# Patient Record
Sex: Male | Born: 1995 | Race: Black or African American | Hispanic: No | Marital: Single | Smoking: Never smoker
Health system: Southern US, Community
[De-identification: ages and names within clinical notes are randomized; demographics above are authoritative.]

## PROBLEM LIST (undated history)

## (undated) ENCOUNTER — Emergency Department (HOSPITAL_BASED_OUTPATIENT_CLINIC_OR_DEPARTMENT_OTHER): Discharge status: Against Medical Advice | Payer: No Typology Code available for payment source

## (undated) DIAGNOSIS — M419 Scoliosis, unspecified: Secondary | ICD-10-CM

---
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
## 2013-03-23 ENCOUNTER — Encounter (HOSPITAL_BASED_OUTPATIENT_CLINIC_OR_DEPARTMENT_OTHER): Payer: Self-pay | Admitting: Emergency Medicine

## 2013-03-23 ENCOUNTER — Emergency Department (HOSPITAL_BASED_OUTPATIENT_CLINIC_OR_DEPARTMENT_OTHER)
Admission: EM | Admit: 2013-03-23 | Discharge: 2013-03-23 | Disposition: A | Discharge status: Home / Self Care | Payer: No Typology Code available for payment source | Attending: Emergency Medicine | Admitting: Emergency Medicine

## 2013-03-23 DIAGNOSIS — IMO0002 Reserved for concepts with insufficient information to code with codable children: Secondary | ICD-10-CM | POA: Insufficient documentation

## 2013-03-23 DIAGNOSIS — Z8739 Personal history of other diseases of the musculoskeletal system and connective tissue: Secondary | ICD-10-CM | POA: Insufficient documentation

## 2013-03-23 DIAGNOSIS — Z79899 Other long term (current) drug therapy: Secondary | ICD-10-CM | POA: Insufficient documentation

## 2013-03-23 DIAGNOSIS — R369 Urethral discharge, unspecified: Secondary | ICD-10-CM | POA: Insufficient documentation

## 2013-03-23 DIAGNOSIS — Z792 Long term (current) use of antibiotics: Secondary | ICD-10-CM | POA: Insufficient documentation

## 2013-03-23 DIAGNOSIS — Z87448 Personal history of other diseases of urinary system: Secondary | ICD-10-CM | POA: Insufficient documentation

## 2013-03-23 LAB — URINALYSIS, ROUTINE W REFLEX MICROSCOPIC
Bilirubin Urine: NEGATIVE
Glucose, UA: NEGATIVE mg/dL
Nitrite: NEGATIVE
Specific Gravity, Urine: 1.024 (ref 1.005–1.030)
pH: 6 (ref 5.0–8.0)

## 2013-03-23 LAB — URINE MICROSCOPIC-ADD ON

## 2013-03-23 MED ORDER — LEVOFLOXACIN 750 MG PO TABS: 750.0000 mg | ORAL_TABLET | Freq: Once | ORAL | Status: DC

## 2013-03-23 MED ORDER — AZITHROMYCIN 250 MG PO TABS
1000.0000 mg | ORAL_TABLET | Freq: Once | ORAL | Status: AC
  Administered 2013-03-23: 1000 mg via ORAL
  Filled 2013-03-23: qty 4

## 2013-03-23 MED ORDER — CEFTRIAXONE SODIUM 250 MG IJ SOLR
250.0000 mg | Freq: Once | INTRAMUSCULAR | Status: AC
  Administered 2013-03-23: 250 mg via INTRAMUSCULAR
  Filled 2013-03-23: qty 250

## 2013-03-23 NOTE — ED Provider Notes (Signed)
CSN: [id number]     Arrival date & time 03/23/13  1315 History   First MD Initiated Contact with Patient 03/23/13 1336     Chief Complaint  Patient presents with  . Dysuria   (Consider location/radiation/quality/duration/timing/severity/associated sxs/prior Treatment) Patient is a 17 y.o. male presenting with dysuria.  Dysuria Pertinent negatives include no chills, fever or sore throat.    17 y/o male here with dysuria and penile discharge for 5 days. He states that the discharge was creamy and white and has now resolved. His girlfriend has been seen by another physician and has been treated for gonorrhea and chlamydia.  He has had an episode of gonorrhea previously and this is very similar to that episode. He denies fevers, chills, sweats, joint pains, and and sore throat.   Past Medical History  Diagnosis Date  . Scoliosis    History reviewed. No pertinent past surgical history. No family history on file. History  Substance Use Topics  . Smoking status: Never Smoker   . Smokeless tobacco: Never Used  . Alcohol Use: No    Review of Systems  Constitutional: Negative for fever and chills.  HENT: Negative for sore throat.   Genitourinary: Positive for dysuria and discharge. Negative for urgency, penile swelling, scrotal swelling, genital sores, penile pain and testicular pain.  All other systems reviewed and are negative.    Allergies  Review of patient's allergies indicates no known allergies.  Home Medications   Current Outpatient Rx  Name  Route  Sig  Dispense  Refill  . cephALEXin (KEFLEX) 500 MG capsule   Oral   Take 1 capsule (500 mg total) by mouth 4 (four) times daily.   40 capsule   0   . docusate sodium (COLACE) 100 MG capsule   Oral   Take 1 capsule (100 mg total) by mouth every 12 (twelve) hours.   60 capsule   0   . HYDROcodone-acetaminophen (NORCO/VICODIN) 5-325 MG per tablet   Oral   Take 1 tablet by mouth every 6 (six) hours as needed for pain.   9 tablet   0   . HYDROcodone-acetaminophen (VICODIN) 5-500 MG per tablet   Oral   Take 1-2 tablets by mouth every 6 (six) hours as needed for pain.   10 tablet   0   . hydrocortisone (ANUSOL-HC) 2.5 % rectal cream      Apply rectally 2 times daily   30 g   0   . ibuprofen (ADVIL,MOTRIN) 600 MG tablet   Oral   Take 1 tablet (600 mg total) by mouth every 8 (eight) hours as needed for pain. Take with food.   20 tablet   0   . polyethylene glycol (MIRALAX) packet   Oral   Take 17 g by mouth daily.   14 each   0   . traMADol (ULTRAM) 50 MG tablet   Oral   Take 1 tablet (50 mg total) by mouth every 6 (six) hours as needed for pain.   6 tablet   0    BP 131/69  Pulse 70  Temp(Src) 98.3 F (36.8 C) (Oral)  Resp 20  SpO2 100% Physical Exam  Constitutional: He appears well-developed and well-nourished. No distress.  HENT:  Head: Normocephalic and atraumatic.  Right Ear: External ear normal.  Left Ear: External ear normal.  Eyes: Conjunctivae are normal. Pupils are equal, round, and reactive to light.  Neck: Neck supple.  Cardiovascular: Normal rate, regular rhythm and normal heart sounds.  No murmur heard. Pulmonary/Chest: Effort normal and breath sounds normal. No respiratory distress. He has no wheezes.  Abdominal: Soft. Bowel sounds are normal.  Genitourinary: Testes normal and penis normal. Right testis shows no mass and no tenderness. Left testis shows no mass and no tenderness. Circumcised. No penile tenderness. No discharge found.  Musculoskeletal: He exhibits no edema and no tenderness.  Neurological: He is alert.  Skin: Skin is warm and dry.  Psychiatric: He has a normal mood and affect.    ED Course  Procedures (including critical care time) Labs Review Labs Reviewed  GC/CHLAMYDIA PROBE AMP  URINALYSIS, ROUTINE W REFLEX MICROSCOPIC   Imaging Review No results found.  EKG Interpretation   None       MDM   1. Penile discharge    Penile  discharge, consistent with Gonorrhea.  Given his PMH and partner with GC will treat with Rocephin and azithro empirically UA, Urine GC probe.    Murtis Sink, MD [facility] Resident, PGY-2 03/23/2013, 2:01 PM       Elenora Gamma, MD 03/23/13 [phone]

## 2013-03-23 NOTE — ED Notes (Signed)
Dysuria and penile d/c x 3 days

## 2013-03-24 LAB — URINE CULTURE: Colony Count: NO GROWTH

## 2013-03-24 NOTE — ED Provider Notes (Signed)
I saw and evaluated the patient, reviewed the resident's note and I agree with the findings and plan.  EKG Interpretation   None       Pt with dysuria and penile discharge, h/o STD refuses urethral swab, UA sent, GC/C sent on urine specimen. Treated empirically. Health Dept follow up.   Charles B. Bernette Mayers, MD 03/24/13 1049

## 2013-03-25 LAB — GC/CHLAMYDIA PROBE AMP: GC Probe RNA: POSITIVE — AB

## 2013-03-27 NOTE — ED Notes (Signed)
+  Gonorrhea and chlamydia Patient treated with Rocephin and zithromax-[facility]

## 2013-03-28 ENCOUNTER — Telehealth (HOSPITAL_COMMUNITY): Payer: Self-pay | Admitting: *Deleted

## 2013-04-01 NOTE — ED Notes (Signed)
Letter sent to [facility] address 

## 2014-05-21 ENCOUNTER — Emergency Department (HOSPITAL_BASED_OUTPATIENT_CLINIC_OR_DEPARTMENT_OTHER): Payer: Medicaid Other

## 2014-05-21 ENCOUNTER — Emergency Department (HOSPITAL_BASED_OUTPATIENT_CLINIC_OR_DEPARTMENT_OTHER)
Admission: EM | Admit: 2014-05-21 | Discharge: 2014-05-21 | Disposition: A | Discharge status: Home / Self Care | Payer: Medicaid Other | Attending: Emergency Medicine | Admitting: Emergency Medicine

## 2014-05-21 ENCOUNTER — Encounter (HOSPITAL_BASED_OUTPATIENT_CLINIC_OR_DEPARTMENT_OTHER): Payer: Self-pay | Admitting: Emergency Medicine

## 2014-05-21 DIAGNOSIS — Z7952 Long term (current) use of systemic steroids: Secondary | ICD-10-CM | POA: Insufficient documentation

## 2014-05-21 DIAGNOSIS — Z79899 Other long term (current) drug therapy: Secondary | ICD-10-CM | POA: Insufficient documentation

## 2014-05-21 DIAGNOSIS — R001 Bradycardia, unspecified: Secondary | ICD-10-CM | POA: Insufficient documentation

## 2014-05-21 DIAGNOSIS — R1110 Vomiting, unspecified: Secondary | ICD-10-CM | POA: Insufficient documentation

## 2014-05-21 DIAGNOSIS — M419 Scoliosis, unspecified: Secondary | ICD-10-CM | POA: Insufficient documentation

## 2014-05-21 DIAGNOSIS — Z792 Long term (current) use of antibiotics: Secondary | ICD-10-CM | POA: Insufficient documentation

## 2014-05-21 DIAGNOSIS — J069 Acute upper respiratory infection, unspecified: Secondary | ICD-10-CM | POA: Insufficient documentation

## 2014-05-21 LAB — RAPID STREP SCREEN (MED CTR MEBANE ONLY): STREPTOCOCCUS, GROUP A SCREEN (DIRECT): NEGATIVE

## 2014-05-21 MED ORDER — PROMETHAZINE HCL 25 MG PO TABS: 25.0000 mg | ORAL_TABLET | Freq: Four times a day (QID) | ORAL | Status: DC | PRN

## 2014-05-21 NOTE — ED Notes (Signed)
PT presents to ED with complaints of fever vomiting dizzy for a week.

## 2014-05-21 NOTE — ED Provider Notes (Signed)
CSN: [id number]     Arrival date & time 05/21/14  1418 History  This chart was scribed for Tilden Fossa, MD by Modena Jansky, ED Scribe. This patient was seen in room MH01/MH01 and the patient's care was started at 3:41 PM.   Chief Complaint  Patient presents with  . Emesis  . Fever  . Dizziness   The history is provided by the patient. No language interpreter was used.   HPI Comments: Edward Bray is a 19 y.o. male who presents to the Emergency Department complaining of cough, chest pain, fever, sore throat.  He reports that he could not get out bed this morning due to his worsening subjective fever and associated symptoms. He states that his associated symptoms include chills, chest pain, headache that started this morning, cough productive of mucous, and a sore throat. He denies any dysuria, ear pain, or rash.   Past Medical History  Diagnosis Date  . Scoliosis    History reviewed. No pertinent past surgical history. No family history on file. History  Substance Use Topics  . Smoking status: Never Smoker   . Smokeless tobacco: Never Used  . Alcohol Use: No    Review of Systems  Constitutional: Positive for fever and chills.  HENT: Positive for sore throat. Negative for ear pain.   Respiratory: Positive for cough.   Cardiovascular: Positive for chest pain.  Genitourinary: Negative for dysuria.  Skin: Negative for rash.  Neurological: Positive for headaches.  All other systems reviewed and are negative.   Allergies  Review of patient's allergies indicates no known allergies.  Home Medications   Prior to Admission medications   Medication Sig Start Date End Date Taking? Authorizing Provider  cephALEXin (KEFLEX) 500 MG capsule Take 1 capsule (500 mg total) by mouth 4 (four) times daily. 10/17/12   Robyn M Hess, PA-C  docusate sodium (COLACE) 100 MG capsule Take 1 capsule (100 mg total) by mouth every 12 (twelve) hours. 04/14/12   Glynn Octave, MD   HYDROcodone-acetaminophen (NORCO/VICODIN) 5-325 MG per tablet Take 1 tablet by mouth every 6 (six) hours as needed for pain. 07/14/12   April K Palumbo-Rasch, MD  HYDROcodone-acetaminophen (VICODIN) 5-500 MG per tablet Take 1-2 tablets by mouth every 6 (six) hours as needed for pain. 03/28/12   Suzi Roots, MD  hydrocortisone (ANUSOL-HC) 2.5 % rectal cream Apply rectally 2 times daily 02/06/12   Dahlia Client Muthersbaugh, PA-C  ibuprofen (ADVIL,MOTRIN) 600 MG tablet Take 1 tablet (600 mg total) by mouth every 8 (eight) hours as needed for pain. Take with food. 03/28/12   Suzi Roots, MD  polyethylene glycol [facility]) packet Take 17 g by mouth daily. 04/14/12   Glynn Octave, MD  traMADol (ULTRAM) 50 MG tablet Take 1 tablet (50 mg total) by mouth every 6 (six) hours as needed for pain. 02/06/12   Hannah Muthersbaugh, PA-C   BP 134/77 mmHg  Pulse 63  Temp(Src) 98.6 F (37 C) (Oral)  Resp 18  Wt 150 lb (68.04 kg)  SpO2 99% Physical Exam  Constitutional: He is oriented to person, place, and time. He appears well-developed and well-nourished.  HENT:  Head: Normocephalic and atraumatic.  Moderate oropharyngeal erythema. TMs normal bilaterally.    Cardiovascular: Regular rhythm.   No murmur heard. Bradycardic.   Pulmonary/Chest: Effort normal and breath sounds normal. No respiratory distress.  Abdominal: Soft. There is no tenderness. There is no rebound and no guarding.  Musculoskeletal: He exhibits no edema or tenderness.  Neurological: He is  alert and oriented to person, place, and time.  Skin: Skin is warm and dry.  Psychiatric: He has a normal mood and affect. His behavior is normal.  Nursing note and vitals reviewed.   ED Course  Procedures (including critical care time) DIAGNOSTIC STUDIES: Oxygen Saturation is 99% on RA, normal by my interpretation.    COORDINATION OF CARE: 3:45 PM- Pt advised of plan for treatment which includes radiology and labs and pt agrees.  Labs  Review Labs Reviewed  RAPID STREP SCREEN  CULTURE, GROUP A STREP    Imaging Review Dg Chest 2 View  05/21/2014   CLINICAL DATA:  Right-sided chest pain, fever.  EXAM: CHEST  2 VIEW  COMPARISON:  June 24, 2012.  FINDINGS: The heart size and mediastinal contours are within normal limits. Both lungs are clear. No pneumothorax or pleural effusion is noted. 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  IMPRESSION: No acute cardiopulmonary abnormality seen.   Electronically Signed   By: Roque Lias M.D.   On: 05/21/2014 15:58     EKG Interpretation None      MDM   Final diagnoses:  Viral upper respiratory illness    Patient here for evaluation of fever, cough, malaise. Patient is nontoxic appearing and well-hydrated with no respiratory distress. He had one episode of emesis at home yesterday. History and exam is consistent with viral upper respiratory infection with viral pharyngitis. Discussed with patient's oral fluid hydration with Tylenol or ibuprofen as needed for fever and body aches. Providing prescription for Phenergan for nausea with close PCP follow-up. Return precautions were discussed.   I personally performed the services described in this documentation, which was scribed in my presence. The recorded information has been reviewed and is accurate.     Tilden Fossa, MD 05/21/14 [phone]

## 2014-05-21 NOTE — Discharge Instructions (Signed)
Upper Respiratory Infection, Adult An upper respiratory infection (URI) is also sometimes known as the common cold. The upper respiratory tract includes the nose, sinuses, throat, trachea, and bronchi. Bronchi are the airways leading to the lungs. Most people improve within 1 week, but symptoms can last up to 2 weeks. A residual cough may last even longer.  CAUSES Many different viruses can infect the tissues lining the upper respiratory tract. The tissues become irritated and inflamed and often become very moist. Mucus production is also common. A cold is contagious. You can easily spread the virus to others by oral contact. This includes kissing, sharing a glass, coughing, or sneezing. Touching your mouth or nose and then touching a surface, which is then touched by another person, can also spread the virus. SYMPTOMS  Symptoms typically develop 1 to 3 days after you come in contact with a cold virus. Symptoms vary from person to person. They may include:  Runny nose.  Sneezing.  Nasal congestion.  Sinus irritation.  Sore throat.  Loss of voice (laryngitis).  Cough.  Fatigue.  Muscle aches.  Loss of appetite.  Headache.  Low-grade fever. DIAGNOSIS  You might diagnose your own cold based on familiar symptoms, since most people get a cold 2 to 3 times a year. Your caregiver can confirm this based on your exam. Most importantly, your caregiver can check that your symptoms are not due to another disease such as strep throat, sinusitis, pneumonia, asthma, or epiglottitis. Blood tests, throat tests, and X-rays are not necessary to diagnose a common cold, but they may sometimes be helpful in excluding other more serious diseases. Your caregiver will decide if any further tests are required. RISKS AND COMPLICATIONS  You may be at risk for a more severe case of the common cold if you smoke cigarettes, have chronic heart disease (such as heart failure) or lung disease (such as asthma), or if  you have a weakened immune system. The very young and very old are also at risk for more serious infections. Bacterial sinusitis, middle ear infections, and bacterial pneumonia can complicate the common cold. The common cold can worsen asthma and chronic obstructive pulmonary disease (COPD). Sometimes, these complications can require emergency medical care and may be life-threatening. PREVENTION  The best way to protect against getting a cold is to practice good hygiene. Avoid oral or hand contact with people with cold symptoms. Wash your hands often if contact occurs. There is no clear evidence that vitamin C, vitamin E, echinacea, or exercise reduces the chance of developing a cold. However, it is always recommended to get plenty of rest and practice good nutrition. TREATMENT  Treatment is directed at relieving symptoms. There is no cure. Antibiotics are not effective, because the infection is caused by a virus, not by bacteria. Treatment may include:  Increased fluid intake. Sports drinks offer valuable electrolytes, sugars, and fluids.  Breathing heated mist or steam (vaporizer or shower).  Eating chicken soup or other clear broths, and maintaining good nutrition.  Getting plenty of rest.  Using gargles or lozenges for comfort.  Controlling fevers with ibuprofen or acetaminophen as directed by your caregiver.  Increasing usage of your inhaler if you have asthma. Zinc gel and zinc lozenges, taken in the first 24 hours of the common cold, can shorten the duration and lessen the severity of symptoms. Pain medicines may help with fever, muscle aches, and throat pain. A variety of non-prescription medicines are available to treat congestion and runny nose. Your caregiver   can make recommendations and may suggest nasal or lung inhalers for other symptoms.  HOME CARE INSTRUCTIONS   Only take over-the-counter or prescription medicines for pain, discomfort, or fever as directed by your  caregiver.  Use a warm mist humidifier or inhale steam from a shower to increase air moisture. This may keep secretions moist and make it easier to breathe.  Drink enough water and fluids to keep your urine clear or pale yellow.  Rest as needed.  Return to work when your temperature has returned to normal or as your caregiver advises. You may need to stay home longer to avoid infecting others. You can also use a face mask and careful hand washing to prevent spread of the virus. SEEK MEDICAL CARE IF:   After the first few days, you feel you are getting worse rather than better.  You need your caregiver's advice about medicines to control symptoms.  You develop chills, worsening shortness of breath, or brown or red sputum. These may be signs of pneumonia.  You develop yellow or brown nasal discharge or pain in the face, especially when you bend forward. These may be signs of sinusitis.  You develop a fever, swollen neck glands, pain with swallowing, or white areas in the back of your throat. These may be signs of strep throat. SEEK IMMEDIATE MEDICAL CARE IF:   You have a fever.  You develop severe or persistent headache, ear pain, sinus pain, or chest pain.  You develop wheezing, a prolonged cough, cough up blood, or have a change in your usual mucus (if you have chronic lung disease).  You develop sore muscles or a stiff neck. Document Released: 10/30/2000 Document Revised: 07/29/2011 Document Reviewed: 08/11/2013 [employer] Patient Information 2015 [employer]. This information is not intended to replace advice given to you by your health care provider. Make sure you discuss any questions you have with your health care provider.  

## 2014-05-23 LAB — CULTURE, GROUP A STREP

## 2014-07-08 ENCOUNTER — Emergency Department (HOSPITAL_BASED_OUTPATIENT_CLINIC_OR_DEPARTMENT_OTHER): Payer: 59

## 2014-07-08 ENCOUNTER — Encounter (HOSPITAL_BASED_OUTPATIENT_CLINIC_OR_DEPARTMENT_OTHER): Payer: Self-pay

## 2014-07-08 ENCOUNTER — Emergency Department (HOSPITAL_BASED_OUTPATIENT_CLINIC_OR_DEPARTMENT_OTHER)
Admission: EM | Admit: 2014-07-08 | Discharge: 2014-07-08 | Disposition: A | Discharge status: Home / Self Care | Payer: 59 | Attending: Emergency Medicine | Admitting: Emergency Medicine

## 2014-07-08 DIAGNOSIS — Z7952 Long term (current) use of systemic steroids: Secondary | ICD-10-CM | POA: Diagnosis not present

## 2014-07-08 DIAGNOSIS — R059 Cough, unspecified: Secondary | ICD-10-CM

## 2014-07-08 DIAGNOSIS — M419 Scoliosis, unspecified: Secondary | ICD-10-CM | POA: Diagnosis not present

## 2014-07-08 DIAGNOSIS — R1110 Vomiting, unspecified: Secondary | ICD-10-CM | POA: Diagnosis present

## 2014-07-08 DIAGNOSIS — Z792 Long term (current) use of antibiotics: Secondary | ICD-10-CM | POA: Insufficient documentation

## 2014-07-08 DIAGNOSIS — R000 Tachycardia, unspecified: Secondary | ICD-10-CM | POA: Insufficient documentation

## 2014-07-08 DIAGNOSIS — B349 Viral infection, unspecified: Secondary | ICD-10-CM | POA: Insufficient documentation

## 2014-07-08 DIAGNOSIS — R05 Cough: Secondary | ICD-10-CM

## 2014-07-08 DIAGNOSIS — Z79899 Other long term (current) drug therapy: Secondary | ICD-10-CM | POA: Insufficient documentation

## 2014-07-08 LAB — BASIC METABOLIC PANEL
ANION GAP: 4 — AB (ref 5–15)
BUN: 10 mg/dL (ref 6–23)
CALCIUM: 8.5 mg/dL (ref 8.4–10.5)
CHLORIDE: 104 mmol/L (ref 96–112)
CO2: 25 mmol/L (ref 19–32)
CREATININE: 0.96 mg/dL (ref 0.50–1.35)
GFR calc Af Amer: >90 mL/min (ref >90)
GFR calc non Af Amer: >90 mL/min (ref >90)
GLUCOSE: 97 mg/dL (ref 70–99)
Potassium: 3.3 mmol/L — ABNORMAL LOW (ref 3.5–5.1)
Sodium: 133 mmol/L — ABNORMAL LOW (ref 135–145)

## 2014-07-08 LAB — CBC WITH DIFFERENTIAL/PLATELET
BASOS ABS: 0 10*3/uL (ref 0.0–0.1)
BASOS PCT: 0 % (ref 0–1)
Eosinophils Absolute: 0 10*3/uL (ref 0.0–0.7)
Eosinophils Relative: 0 % (ref 0–5)
HEMATOCRIT: 42.9 % (ref 39.0–52.0)
Hemoglobin: 14.3 g/dL (ref 13.0–17.0)
LYMPHS PCT: 10 % — AB (ref 12–46)
Lymphs Abs: 0.7 10*3/uL (ref 0.7–4.0)
MCH: 31.1 pg (ref 26.0–34.0)
MCHC: 33.3 g/dL (ref 30.0–36.0)
MCV: 93.3 fL (ref 78.0–100.0)
Monocytes Absolute: 1.2 10*3/uL — ABNORMAL HIGH (ref 0.1–1.0)
Monocytes Relative: 17 % — ABNORMAL HIGH (ref 3–12)
Neutro Abs: 5 10*3/uL (ref 1.7–7.7)
Neutrophils Relative %: 73 % (ref 43–77)
PLATELETS: 175 10*3/uL (ref 150–400)
RBC: 4.6 MIL/uL (ref 4.22–5.81)
RDW: 11.7 % (ref 11.5–15.5)
WBC: 6.9 10*3/uL (ref 4.0–10.5)

## 2014-07-08 MED ORDER — KETOROLAC TROMETHAMINE 30 MG/ML IJ SOLN
30.0000 mg | Freq: Once | INTRAMUSCULAR | Status: AC
  Administered 2014-07-08: 30 mg via INTRAVENOUS
  Filled 2014-07-08: qty 1

## 2014-07-08 MED ORDER — DIPHENOXYLATE-ATROPINE 2.5-0.025 MG PO TABS: 2.0000 | ORAL_TABLET | Freq: Four times a day (QID) | ORAL | Status: DC | PRN

## 2014-07-08 MED ORDER — SODIUM CHLORIDE 0.9 % IV BOLUS (SEPSIS)
2000.0000 mL | Freq: Once | INTRAVENOUS | Status: AC
  Administered 2014-07-08: 2000 mL via INTRAVENOUS

## 2014-07-08 MED ORDER — ONDANSETRON HCL 4 MG/2ML IJ SOLN
4.0000 mg | Freq: Once | INTRAMUSCULAR | Status: AC
  Administered 2014-07-08: 4 mg via INTRAVENOUS
  Filled 2014-07-08: qty 2

## 2014-07-08 MED ORDER — LOPERAMIDE HCL 2 MG PO CAPS
2.0000 mg | ORAL_CAPSULE | ORAL | Status: DC | PRN
  Administered 2014-07-08: 2 mg via ORAL
  Filled 2014-07-08: qty 1

## 2014-07-08 MED ORDER — ONDANSETRON 8 MG PO TBDP: 8.0000 mg | ORAL_TABLET | Freq: Three times a day (TID) | ORAL | Status: DC | PRN

## 2014-07-08 MED ORDER — SODIUM CHLORIDE 0.9 % IV SOLN: INTRAVENOUS | Status: DC

## 2014-07-08 NOTE — ED Notes (Signed)
Reports emesis since last night and fever. Sts he was exposed to sick family members yesterday with the flu. Reports unable to keep anything down. Reports body aches.

## 2014-07-08 NOTE — ED Provider Notes (Signed)
CSN: [id number]     Arrival date & time 07/08/14  0849 History   First MD Initiated Contact with Patient 07/08/14 0857     Chief Complaint  Patient presents with  . Emesis     (Consider location/radiation/quality/duration/timing/severity/associated sxs/prior Treatment) HPI Comments: Patient with 2 days of cough congestion and myalgias with fever that began last night. Notes positive sick exposures to family members who have been diagnosed with flu. Has had watery diarrhea nonbilious emesis. Abdominal. Cough is nonproductive. Slight sore throat with right ear pain. Symptoms persistent and no treatment use prior to arrival.  Patient is a 19 y.o. male presenting with vomiting. The history is provided by the patient.  Emesis   Past Medical History  Diagnosis Date  . Scoliosis    History reviewed. No pertinent past surgical history. No family history on file. History  Substance Use Topics  . Smoking status: Never Smoker   . Smokeless tobacco: Never Used  . Alcohol Use: No    Review of Systems  Gastrointestinal: Positive for vomiting.  All other systems reviewed and are negative.     Allergies  Review of patient's allergies indicates no known allergies.  Home Medications   Prior to Admission medications   Medication Sig Start Date End Date Taking? Authorizing Provider  cephALEXin (KEFLEX) 500 MG capsule Take 1 capsule (500 mg total) by mouth 4 (four) times daily. 10/17/12   Robyn M Hess, PA-C  docusate sodium (COLACE) 100 MG capsule Take 1 capsule (100 mg total) by mouth every 12 (twelve) hours. 04/14/12   Glynn Octave, MD  HYDROcodone-acetaminophen (NORCO/VICODIN) 5-325 MG per tablet Take 1 tablet by mouth every 6 (six) hours as needed for pain. 07/14/12   April K Palumbo-Rasch, MD  HYDROcodone-acetaminophen (VICODIN) 5-500 MG per tablet Take 1-2 tablets by mouth every 6 (six) hours as needed for pain. 03/28/12   Suzi Roots, MD  hydrocortisone (ANUSOL-HC) 2.5 % rectal  cream Apply rectally 2 times daily 02/06/12   Dahlia Client Muthersbaugh, PA-C  ibuprofen (ADVIL,MOTRIN) 600 MG tablet Take 1 tablet (600 mg total) by mouth every 8 (eight) hours as needed for pain. Take with food. 03/28/12   Suzi Roots, MD  polyethylene glycol [facility]) packet Take 17 g by mouth daily. 04/14/12   Glynn Octave, MD  promethazine (PHENERGAN) 25 MG tablet Take 1 tablet (25 mg total) by mouth every 6 (six) hours as needed for nausea or vomiting. 05/21/14   Tilden Fossa, MD  traMADol (ULTRAM) 50 MG tablet Take 1 tablet (50 mg total) by mouth every 6 (six) hours as needed for pain. 02/06/12   Hannah Muthersbaugh, PA-C   BP 124/81 mmHg  Pulse 115  Temp(Src) 100.5 F (38.1 C) (Oral)  Resp 16  Wt 140 lb (63.504 kg)  SpO2 97% Physical Exam  Constitutional: He is oriented to person, place, and time. He appears well-developed and well-nourished.  Non-toxic appearance. No distress.  HENT:  Head: Normocephalic and atraumatic.  Eyes: Conjunctivae, EOM and lids are normal. Pupils are equal, round, and reactive to light.  Neck: Normal range of motion. Neck supple. No tracheal deviation present. No thyroid mass present.  Cardiovascular: Regular rhythm and normal heart sounds.  Tachycardia present.  Exam reveals no gallop.   No murmur heard. Pulmonary/Chest: Effort normal and breath sounds normal. No stridor. No respiratory distress. He has no decreased breath sounds. He has no wheezes. He has no rhonchi. He has no rales.  Abdominal: Soft. Normal appearance and bowel sounds are  normal. He exhibits no distension. There is no tenderness. There is no rebound and no CVA tenderness.  Musculoskeletal: Normal range of motion. He exhibits no edema or tenderness.  Neurological: He is alert and oriented to person, place, and time. He has normal strength. No cranial nerve deficit or sensory deficit. GCS eye subscore is 4. GCS verbal subscore is 5. GCS motor subscore is 6.  Skin: Skin is warm and dry. No  abrasion and no rash noted.  Psychiatric: He has a normal mood and affect. His speech is normal and behavior is normal.  Nursing note and vitals reviewed.   ED Course  Procedures (including critical care time) Labs Review Labs Reviewed - No data to display  Imaging Review No results found.   EKG Interpretation None      MDM   Final diagnoses:  None   Patient given IV fluids and meds and feels better. Suspect that he has the flu. He is over 48 hours and will not be given Tamiflu. He'll be given instructions for supportive treatment    Toy BakerAnthony T Oluwaseun Cremer, MD 07/08/14 1022

## 2014-07-08 NOTE — ED Notes (Signed)
Patient transported to X-ray 

## 2014-07-08 NOTE — Discharge Instructions (Signed)

## 2014-07-10 ENCOUNTER — Emergency Department (HOSPITAL_BASED_OUTPATIENT_CLINIC_OR_DEPARTMENT_OTHER)
Admission: EM | Admit: 2014-07-10 | Discharge: 2014-07-10 | Disposition: A | Discharge status: Home / Self Care | Payer: 59 | Attending: Emergency Medicine | Admitting: Emergency Medicine

## 2014-07-10 ENCOUNTER — Encounter (HOSPITAL_BASED_OUTPATIENT_CLINIC_OR_DEPARTMENT_OTHER): Payer: Self-pay

## 2014-07-10 DIAGNOSIS — M549 Dorsalgia, unspecified: Secondary | ICD-10-CM | POA: Diagnosis not present

## 2014-07-10 DIAGNOSIS — B349 Viral infection, unspecified: Secondary | ICD-10-CM | POA: Diagnosis not present

## 2014-07-10 DIAGNOSIS — R52 Pain, unspecified: Secondary | ICD-10-CM

## 2014-07-10 DIAGNOSIS — M940 Chondrocostal junction syndrome [Tietze]: Secondary | ICD-10-CM | POA: Insufficient documentation

## 2014-07-10 DIAGNOSIS — M419 Scoliosis, unspecified: Secondary | ICD-10-CM | POA: Diagnosis not present

## 2014-07-10 DIAGNOSIS — R059 Cough, unspecified: Secondary | ICD-10-CM

## 2014-07-10 DIAGNOSIS — R05 Cough: Secondary | ICD-10-CM

## 2014-07-10 MED ORDER — NAPROXEN 250 MG PO TABS
500.0000 mg | ORAL_TABLET | Freq: Once | ORAL | Status: AC
  Administered 2014-07-10: 500 mg via ORAL
  Filled 2014-07-10: qty 2

## 2014-07-10 MED ORDER — NAPROXEN 500 MG PO TABS: 500.0000 mg | ORAL_TABLET | Freq: Two times a day (BID) | ORAL | Status: DC | PRN

## 2014-07-10 NOTE — ED Notes (Signed)
Patient here for further evaluation of body aches and general malaise. Reports that he was seen here Friday and diagnosed with the flu. States when he eats and drinks feels like going right through and causing epigastric discomfort

## 2014-07-10 NOTE — ED Provider Notes (Signed)
CSN: [id number]     Arrival date & time 07/10/14  1057 History   First MD Initiated Contact with Patient 07/10/14 1205     Chief Complaint  Patient presents with  . Generalized Body Aches     (Consider location/radiation/quality/duration/timing/severity/associated sxs/prior Treatment) HPI Comments: Edward Bray is a 19 y.o. male with a PMHx of scoliosis, who presents to the ED with complaints of ongoing generalized body aches that he describes as 10/10 sore aching constant nonradiating pain diffusely throughout his back and upper body, worse with coughing or eating, with no medications tried prior to arrival. He also endorses a cough with clearish yellow sputum production. He was seen here 3 days ago and diagnosed with the flu, has sick contacts at home with the flu, and states he is overall doing better less symptoms, but he is continuing to have body aches. Denies any fevers, chills, chest pain, shortness breath, wheezing, abdominal pain, nausea, vomiting, diarrhea, dysuria, hematuria, numbness, tingling, weakness, rhinorrhea, ear pain or drainage, or sore throat.  Patient is a 19 y.o. male presenting with URI. The history is provided by the patient. No language interpreter was used.  URI Presenting symptoms: cough   Presenting symptoms: no congestion, no ear pain, no fever, no rhinorrhea and no sore throat   Severity:  Mild Onset quality:  Gradual Duration:  3 days Timing:  Constant Progression:  Improving Chronicity:  New Relieved by:  None tried Worsened by:  Nothing tried Ineffective treatments:  None tried Associated symptoms: myalgias (generalized)   Associated symptoms: no arthralgias, no headaches, no neck pain, no sinus pain and no wheezing   Risk factors: sick contacts     Past Medical History  Diagnosis Date  . Scoliosis    History reviewed. No pertinent past surgical history. No family history on file. History  Substance Use Topics  . Smoking status: Never Smoker    . Smokeless tobacco: Never Used  . Alcohol Use: No    Review of Systems  Constitutional: Negative for fever and chills.  HENT: Negative for congestion, ear discharge, ear pain, rhinorrhea and sore throat.   Respiratory: Positive for cough. Negative for shortness of breath and wheezing.   Cardiovascular: Negative for chest pain.  Gastrointestinal: Negative for nausea, vomiting, abdominal pain, diarrhea and constipation.  Genitourinary: Negative for dysuria, hematuria and flank pain.  Musculoskeletal: Positive for myalgias (generalized). Negative for arthralgias and neck pain.  Skin: Negative for rash.  Allergic/Immunologic: Negative for immunocompromised state.  Neurological: Negative for weakness, light-headedness, numbness and headaches.   10 Systems reviewed and are negative for acute change except as noted in the HPI.    Allergies  Review of patient's allergies indicates no known allergies.  Home Medications   Prior to Admission medications   Medication Sig Start Date End Date Taking? Authorizing Provider  diphenoxylate-atropine (LOMOTIL) 2.5-0.025 MG per tablet Take 2 tablets by mouth 4 (four) times daily as needed for diarrhea or loose stools. 07/08/14   Toy Baker, MD  HYDROcodone-acetaminophen (VICODIN) 5-500 MG per tablet Take 1-2 tablets by mouth every 6 (six) hours as needed for pain. 03/28/12   Suzi Roots, MD  ibuprofen (ADVIL,MOTRIN) 600 MG tablet Take 1 tablet (600 mg total) by mouth every 8 (eight) hours as needed for pain. Take with food. 03/28/12   Suzi Roots, MD  ondansetron (ZOFRAN ODT) 8 MG disintegrating tablet Take 1 tablet (8 mg total) by mouth every 8 (eight) hours as needed for nausea or vomiting. 07/08/14  Toy BakerAnthony T Allen, MD  promethazine (PHENERGAN) 25 MG tablet Take 1 tablet (25 mg total) by mouth every 6 (six) hours as needed for nausea or vomiting. 05/21/14   Tilden FossaElizabeth Rees, MD   BP 123/79 mmHg  Pulse 85  Temp(Src) 98.5 F (36.9 C) (Oral)   Resp 18  Ht 5\' 9"  (1.753 m)  Wt 145 lb (65.772 kg)  BMI 21.40 kg/m2  SpO2 95% Physical Exam  Constitutional: He is oriented to person, place, and time. Vital signs are normal. He appears well-developed and well-nourished.  Non-toxic appearance. No distress.  Afebrile nontoxic NAD  HENT:  Head: Normocephalic and atraumatic.  Right Ear: External ear normal.  Left Ear: External ear normal.  Nose: Nose normal.  Mouth/Throat: Oropharynx is clear and moist and mucous membranes are normal. No oropharyngeal exudate.  Eyes: Conjunctivae and EOM are normal. Right eye exhibits no discharge. Left eye exhibits no discharge.  Neck: Normal range of motion. Neck supple.  Cardiovascular: Normal rate, regular rhythm, normal heart sounds and intact distal pulses.  Exam reveals no gallop and no friction rub.   No murmur heard. Pulmonary/Chest: Effort normal and breath sounds normal. No respiratory distress. He has no decreased breath sounds. He has no wheezes. He has no rhonchi. He has no rales. He exhibits tenderness. He exhibits no crepitus and no deformity.    B/l rib cage TTP along costal margin, no crepitus or deformity  Abdominal: Soft. Normal appearance and bowel sounds are normal. He exhibits no distension. There is no tenderness. There is no rigidity, no rebound, no guarding, no CVA tenderness, no tenderness at McBurney's point and negative Murphy's sign.  Musculoskeletal: Normal range of motion.  Diffuse paraspinous muscle TTP throughout all spinal levels, no midline bony TTP or deformities, strength and sensation grossly intact, distal pulses intact  Neurological: He is alert and oriented to person, place, and time. He has normal strength. No sensory deficit.  Skin: Skin is warm, dry and intact. No rash noted.  Psychiatric: He has a normal mood and affect.  Nursing note and vitals reviewed.   ED Course  Procedures (including critical care time) Labs Review Labs Reviewed - No data to  display  Imaging Review No results found.   EKG Interpretation None      MDM   Final diagnoses:  Viral illness  Cough  Body aches    19 y.o. male here with ongoing body aches and cough, no longer having fevers or n/v. Tolerating PO well. Exam reveals rib cage tenderness, c/w costochondritis. Hasn't tried anything at home. Offered toradol and pt refused, opted for PO meds. Given naprosyn with relief. Doubt need for labs or imaging today, likely flu illness, and pt has clear lung exam. Will have him f/up with PCP from resource guide. Discussed tylenol/naprosyn for pain. I explained the diagnosis and have given explicit precautions to return to the ER including for any other new or worsening symptoms. The patient understands and accepts the medical plan as it's been dictated and I have answered their questions. Discharge instructions concerning home care and prescriptions have been given. The patient is STABLE and is discharged to home in good condition.  BP 120/73 mmHg  Pulse 60  Temp(Src) 98.4 F (36.9 C) (Oral)  Resp 18  Ht 5\' 9"  (1.753 m)  Wt 145 lb (65.772 kg)  BMI 21.40 kg/m2  SpO2 100%  Meds ordered this encounter  Medications  . naproxen (NAPROSYN) tablet 500 mg    Sig:   .  naproxen (NAPROSYN) 500 MG tablet    Sig: Take 1 tablet (500 mg total) by mouth 2 (two) times daily as needed for mild pain, moderate pain or headache (TAKE WITH MEALS.).    Dispense:  20 tablet    Refill:  0    Order Specific Question:  Supervising Provider    Answer:  Vida Roller [address] [place], PA-C 07/10/14 1253  Gerhard Munch, MD 07/10/14 [phone]

## 2014-07-10 NOTE — Discharge Instructions (Signed)
Continue to stay well-hydrated and get plenty of rest. Continue to alternate between Tylenol and naprosyn for pain or fever. Use Mucinex for cough suppression/expectoration of mucus. May consider over-the-counter Benadryl or other antihistamine to decrease secretions and for watery itchy eyes. Followup with your primary care doctor/use the resource guide below to find one in 5-7 days for recheck of ongoing symptoms. Return to emergency department for emergent changing or worsening of symptoms.   Viral Infections A virus is a type of germ. Viruses can cause:  Minor sore throats.  Aches and pains.  Headaches.  Runny nose.  Rashes.  Watery eyes.  Tiredness.  Coughs.  Loss of appetite.  Feeling sick to your stomach (nausea).  Throwing up (vomiting).  Watery poop (diarrhea). HOME CARE   Only take medicines as told by your doctor.  Drink enough water and fluids to keep your pee (urine) clear or pale yellow. Sports drinks are a good choice.  Get plenty of rest and eat healthy. Soups and broths with crackers or rice are fine. GET HELP RIGHT AWAY IF:   You have a very bad headache.  You have shortness of breath.  You have chest pain or neck pain.  You have an unusual rash.  You cannot stop throwing up.  You have watery poop that does not stop.  You cannot keep fluids down.  You or your child has a temperature by mouth above 102 F (38.9 C), not controlled by medicine.  Your baby is older than 3 months with a rectal temperature of 102 F (38.9 C) or higher.  Your baby is 6 months old or younger with a rectal temperature of 100.4 F (38 C) or higher. MAKE SURE YOU:   Understand these instructions.  Will watch this condition.  Will get help right away if you are not doing well or get worse. Document Released: 04/18/2008 Document Revised: 07/29/2011 Document Reviewed: 09/11/2010 [facility] Patient Information 2015 [place], [place]. This information is not intended  to replace advice given to you by your health care provider. Make sure you discuss any questions you have with your health care provider.  Musculoskeletal Pain Musculoskeletal pain is muscle and boney aches and pains. These pains can occur in any part of the body. Your caregiver may treat you without knowing the cause of the pain. They may treat you if blood or urine tests, X-rays, and other tests were normal.  CAUSES There is often not a definite cause or reason for these pains. These pains may be caused by a type of germ (virus). The discomfort may also come from overuse. Overuse includes working out too hard when your body is not fit. Boney aches also come from weather changes. Bone is sensitive to atmospheric pressure changes. HOME CARE INSTRUCTIONS   Ask when your test results will be ready. Make sure you get your test results.  Only take over-the-counter or prescription medicines for pain, discomfort, or fever as directed by your caregiver. If you were given medications for your condition, do not drive, operate machinery or power tools, or sign legal documents for 24 hours. Do not drink alcohol. Do not take sleeping pills or other medications that may interfere with treatment.  Continue all activities unless the activities cause more pain. When the pain lessens, slowly resume normal activities. Gradually increase the intensity and duration of the activities or exercise.  During periods of severe pain, bed rest may be helpful. Lay or sit in any position that is comfortable.  Putting ice  on the injured area.  Put ice in a bag.  Place a towel between your skin and the bag.  Leave the ice on for 15 to 20 minutes, 3 to 4 times a day.  Follow up with your caregiver for continued problems and no reason can be found for the pain. If the pain becomes worse or does not go away, it may be necessary to repeat tests or do additional testing. Your caregiver may need to look further for a possible  cause. SEEK IMMEDIATE MEDICAL CARE IF:  You have pain that is getting worse and is not relieved by medications.  You develop chest pain that is associated with shortness or breath, sweating, feeling sick to your stomach (nauseous), or throw up (vomit).  Your pain becomes localized to the abdomen.  You develop any new symptoms that seem different or that concern you. MAKE SURE YOU:   Understand these instructions.  Will watch your condition.  Will get help right away if you are not doing well or get worse. Document Released: 05/06/2005 Document Revised: 07/29/2011 Document Reviewed: 01/08/2013 [facility] Patient Information 2015 [place], [place]. This information is not intended to replace advice given to you by your health care provider. Make sure you discuss any questions you have with your health care provider.   Emergency Department Resource Guide 1) Find a Doctor and Pay Out of Pocket Although you won't have to find out who is covered by your insurance plan, it is a good idea to ask around and get recommendations. You will then need to call the office and see if the doctor you have chosen will accept you as a new patient and what types of options they offer for patients who are self-pay. Some doctors offer discounts or will set up payment plans for their patients who do not have insurance, but you will need to ask so you aren't surprised when you get to your appointment.  2) Contact Your Local Health Department Not all health departments have doctors that can see patients for sick visits, but many do, so it is worth a call to see if yours does. If you don't know where your local health department is, you can check in your phone book. The CDC also has a tool to help you locate your state's health department, and many state websites also have listings of all of their local health departments.  3) Find a Walk-in Clinic If your illness is not likely to be very severe or complicated, you may  want to try a walk in clinic. These are popping up all over the country in pharmacies, drugstores, and shopping centers. They're usually staffed by nurse practitioners or physician assistants that have been trained to treat common illnesses and complaints. They're usually fairly quick and inexpensive. However, if you have serious medical issues or chronic medical problems, these are probably not your best option.  No Primary Care Doctor: - Call Health Connect at  [phone] - they can help you locate a primary care doctor that  accepts your insurance, provides certain services, etc. - Physician Referral Service- [phone]  Chronic Pain Problems: Organization         Address  Phone   Notes  [facility]  [phone] Patients need to be referred by their primary care doctor.   Medication Assistance: Organization         Address  Phone   Notes  [facility] [address] [phone]  [place], [place] [address] [phone]) [phone] --Must be a resident of [place] -- Must have NO insurance coverage whatsoever (no Medicaid/ Medicare, etc.) -- The pt. MUST have a primary care doctor that directs their care regularly and follows them in the community   [facility]  [phone]) [phone]   [employer]  [phone]    Agencies that provide inexpensive medical care: Organization         Address  Phone   Notes  [facility]  [phone]   [facility]    [phone]) [phone]   [facility] [address] [place], [place] [address] [phone]) [phone]   [facility] [address], [place] [phone]   [facility]    [phone]) [phone]   [facility]    [phone]   [facility]  [address], [place] Phone:  [phone], Fax:  [phone] Hours of Operation:  9 am - 6 pm, M-F.  Also accepts Medicaid/Medicare and self-pay.   [facility]  [address], [place] Phone: [phone]) [phone], Fax: [phone]. Hours of Operation:  8:30 am - 5:30 pm, M-F.  Also accepts Medicaid and self-pay.  [facility] [place] [address], [place] Phone: [phone]) [phone]   [facility] [address], [place], [place] [phone], Ext. [phone] Mondays & Thursdays: 7-9 AM.  First 15 patients are seen on a first come, first serve basis.    Medicaid-accepting [facility] [place] Providers:  Organization         Address  Phone   Notes  [facility] [address], [place] [phone]) [phone] Also accepts self-pay patients.  [facility] [address], [place]  [phone]) [phone]   [facility] [address], [place] [phone]   [facility] [address] [place] [address], [place] [phone]   Renaye RakersVeita Bland [address], [place]   [phone] Only accepts [place] patients after they have their name applied to their card.   Self-Pay (no insurance) in [place] [facility]:  Organization         Address  Phone   Notes  Sickle Cell Patients, [facility] [address], [place] [phone]) [phone]   [facility] [address], [place] [phone]   [facility]  [address], [place] [phone]   [facility]/Dr. Osei-Bonsu  [address] [place] [address], [place] or [phone], [place] [phone] Phone number for both [place] and [place] locations is the same.  [facility] [address], [place] [phone]   [facility] [address] [place] [address], [place] or [address] [phone]) [phone]) [phone]   [facility] [address], [place] [phone], phone; [phone])  [phone], fax Sees patients 1st and 3rd Saturday of every month.  Must not qualify for public or private insurance (i.e. Medicaid, Medicare, [place] Health Choice, Veterans' Benefits)  Household income should be no more than 200% of the poverty level The clinic cannot treat you if you are pregnant or think you are pregnant  Sexually transmitted diseases are not treated at the clinic.

## 2014-10-25 ENCOUNTER — Emergency Department (HOSPITAL_BASED_OUTPATIENT_CLINIC_OR_DEPARTMENT_OTHER)
Admission: EM | Admit: 2014-10-25 | Discharge: 2014-10-25 | Disposition: A | Discharge status: Home / Self Care | Payer: 59 | Attending: Emergency Medicine | Admitting: Emergency Medicine

## 2014-10-25 ENCOUNTER — Emergency Department (HOSPITAL_BASED_OUTPATIENT_CLINIC_OR_DEPARTMENT_OTHER): Payer: 59

## 2014-10-25 ENCOUNTER — Encounter (HOSPITAL_BASED_OUTPATIENT_CLINIC_OR_DEPARTMENT_OTHER): Payer: Self-pay | Admitting: *Deleted

## 2014-10-25 DIAGNOSIS — S161XXA Strain of muscle, fascia and tendon at neck level, initial encounter: Secondary | ICD-10-CM | POA: Insufficient documentation

## 2014-10-25 DIAGNOSIS — S29092A Other injury of muscle and tendon of back wall of thorax, initial encounter: Secondary | ICD-10-CM | POA: Diagnosis not present

## 2014-10-25 DIAGNOSIS — Y9389 Activity, other specified: Secondary | ICD-10-CM | POA: Insufficient documentation

## 2014-10-25 DIAGNOSIS — Y998 Other external cause status: Secondary | ICD-10-CM | POA: Insufficient documentation

## 2014-10-25 DIAGNOSIS — S3992XA Unspecified injury of lower back, initial encounter: Secondary | ICD-10-CM | POA: Diagnosis not present

## 2014-10-25 DIAGNOSIS — M419 Scoliosis, unspecified: Secondary | ICD-10-CM | POA: Diagnosis not present

## 2014-10-25 DIAGNOSIS — S20219A Contusion of unspecified front wall of thorax, initial encounter: Secondary | ICD-10-CM | POA: Insufficient documentation

## 2014-10-25 DIAGNOSIS — S199XXA Unspecified injury of neck, initial encounter: Secondary | ICD-10-CM | POA: Diagnosis present

## 2014-10-25 DIAGNOSIS — Y92410 Unspecified street and highway as the place of occurrence of the external cause: Secondary | ICD-10-CM | POA: Diagnosis not present

## 2014-10-25 MED ORDER — HYDROCODONE-ACETAMINOPHEN 5-325 MG PO TABS
1.0000 | ORAL_TABLET | Freq: Once | ORAL | Status: AC
  Administered 2014-10-25: 1 via ORAL
  Filled 2014-10-25: qty 1

## 2014-10-25 MED ORDER — HYDROCODONE-ACETAMINOPHEN 5-325 MG PO TABS: 1.0000 | ORAL_TABLET | Freq: Four times a day (QID) | ORAL | Status: DC | PRN

## 2014-10-25 MED ORDER — NAPROXEN 250 MG PO TABS
500.0000 mg | ORAL_TABLET | Freq: Once | ORAL | Status: AC
  Administered 2014-10-25: 500 mg via ORAL
  Filled 2014-10-25: qty 2

## 2014-10-25 MED ORDER — CYCLOBENZAPRINE HCL 10 MG PO TABS: 10.0000 mg | ORAL_TABLET | Freq: Two times a day (BID) | ORAL | Status: DC | PRN

## 2014-10-25 MED ORDER — NAPROXEN 500 MG PO TABS: 500.0000 mg | ORAL_TABLET | Freq: Two times a day (BID) | ORAL | Status: DC
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
## 2014-10-25 NOTE — Discharge Instructions (Signed)
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
## 2014-10-31 ENCOUNTER — Emergency Department (HOSPITAL_BASED_OUTPATIENT_CLINIC_OR_DEPARTMENT_OTHER)
Admission: EM | Admit: 2014-10-31 | Discharge: 2014-10-31 | Disposition: A | Discharge status: Home / Self Care | Payer: 59 | Attending: Emergency Medicine | Admitting: Emergency Medicine

## 2014-10-31 ENCOUNTER — Encounter (HOSPITAL_BASED_OUTPATIENT_CLINIC_OR_DEPARTMENT_OTHER): Payer: Self-pay | Admitting: Emergency Medicine

## 2014-10-31 DIAGNOSIS — M419 Scoliosis, unspecified: Secondary | ICD-10-CM | POA: Insufficient documentation

## 2014-10-31 DIAGNOSIS — Y92410 Unspecified street and highway as the place of occurrence of the external cause: Secondary | ICD-10-CM | POA: Diagnosis not present

## 2014-10-31 DIAGNOSIS — Z791 Long term (current) use of non-steroidal anti-inflammatories (NSAID): Secondary | ICD-10-CM | POA: Diagnosis not present

## 2014-10-31 DIAGNOSIS — Y998 Other external cause status: Secondary | ICD-10-CM | POA: Insufficient documentation

## 2014-10-31 DIAGNOSIS — M542 Cervicalgia: Secondary | ICD-10-CM | POA: Diagnosis not present

## 2014-10-31 DIAGNOSIS — Y9389 Activity, other specified: Secondary | ICD-10-CM | POA: Diagnosis not present

## 2014-10-31 DIAGNOSIS — S39012A Strain of muscle, fascia and tendon of lower back, initial encounter: Secondary | ICD-10-CM | POA: Diagnosis not present

## 2014-10-31 DIAGNOSIS — S29019D Strain of muscle and tendon of unspecified wall of thorax, subsequent encounter: Secondary | ICD-10-CM

## 2014-10-31 DIAGNOSIS — Z041 Encounter for examination and observation following transport accident: Secondary | ICD-10-CM | POA: Diagnosis present

## 2014-10-31 DIAGNOSIS — S29012D Strain of muscle and tendon of back wall of thorax, subsequent encounter: Secondary | ICD-10-CM | POA: Diagnosis not present

## 2014-10-31 MED ORDER — CYCLOBENZAPRINE HCL 10 MG PO TABS: 10.0000 mg | ORAL_TABLET | Freq: Three times a day (TID) | ORAL | Status: DC | PRN
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
## 2014-10-31 NOTE — Discharge Instructions (Signed)
Ibuprofen 600 mg every 6 hours as needed for pain.  Flexeril as needed for pain not relieved with ibuprofen.  Follow-up with your primary Dr. if not improving in the next week.   Back Pain, Adult Low back pain is very common. About 1 in 5 people have back pain.The cause of low back pain is rarely dangerous. The pain often gets better over time.About half of people with a sudden onset of back pain feel better in just 2 weeks. About 8 in 10 people feel better by 6 weeks.  CAUSES Some common causes of back pain include:  Strain of the muscles or ligaments supporting the spine.  Wear and tear (degeneration) of the spinal discs.  Arthritis.  Direct injury to the back. DIAGNOSIS Most of the time, the direct cause of low back pain is not known.However, back pain can be treated effectively even when the exact cause of the pain is unknown.Answering your caregiver's questions about your overall health and symptoms is one of the most accurate ways to make sure the cause of your pain is not dangerous. If your caregiver needs more information, he or she may order lab work or imaging tests (X-rays or MRIs).However, even if imaging tests show changes in your back, this usually does not require surgery. HOME CARE INSTRUCTIONS For many people, back pain returns.Since low back pain is rarely dangerous, it is often a condition that people can learn to [facility] their own.   Remain active. It is stressful on the back to sit or stand in one place. Do not sit, drive, or stand in one place for more than 30 minutes at a time. Take short walks on level surfaces as soon as pain allows.Try to increase the length of time you walk each day.  Do not stay in bed.Resting more than 1 or 2 days can delay your recovery.  Do not avoid exercise or work.Your body is made to move.It is not dangerous to be active, even though your back may hurt.Your back will likely heal faster if you return to being active before  your pain is gone.  Pay attention to your body when you bend and lift. Many people have less discomfortwhen lifting if they bend their knees, keep the load close to their bodies,and avoid twisting. Often, the most comfortable positions are those that put less stress on your recovering back.  Find a comfortable position to sleep. Use a firm mattress and lie on your side with your knees slightly bent. If you lie on your back, put a pillow under your knees.  Only take over-the-counter or prescription medicines as directed by your caregiver. Over-the-counter medicines to reduce pain and inflammation are often the most helpful.Your caregiver may prescribe muscle relaxant drugs.These medicines help dull your pain so you can more quickly return to your normal activities and healthy exercise.  Put ice on the injured area.  Put ice in a plastic bag.  Place a towel between your skin and the bag.  Leave the ice on for 15-20 minutes, 03-04 times a day for the first 2 to 3 days. After that, ice and heat may be alternated to reduce pain and spasms.  Ask your caregiver about trying back exercises and gentle massage. This may be of some benefit.  Avoid feeling anxious or stressed.Stress increases muscle tension and can worsen back pain.It is important to recognize when you are anxious or stressed and learn ways to manage it.Exercise is a great option. SEEK MEDICAL CARE IF:  You have pain that is not relieved with rest or medicine.  You have pain that does not improve in 1 week.  You have new symptoms.  You are generally not feeling well. SEEK IMMEDIATE MEDICAL CARE IF:   You have pain that radiates from your back into your legs.  You develop new bowel or bladder control problems.  You have unusual weakness or numbness in your arms or legs.  You develop nausea or vomiting.  You develop abdominal pain.  You feel faint. Document Released: 05/06/2005 Document Revised: 11/05/2011 Document  Reviewed: 09/07/2013 [facility] Patient Information 2015 [employer], [place]. This information is not intended to replace advice given to you by your health care provider. Make sure you discuss any questions you have with your health care provider.
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
## 2017-09-09 ENCOUNTER — Other Ambulatory Visit: Payer: Self-pay

## 2017-09-09 ENCOUNTER — Emergency Department (HOSPITAL_BASED_OUTPATIENT_CLINIC_OR_DEPARTMENT_OTHER): Payer: No Typology Code available for payment source

## 2017-09-09 ENCOUNTER — Emergency Department (HOSPITAL_BASED_OUTPATIENT_CLINIC_OR_DEPARTMENT_OTHER)
Admission: EM | Admit: 2017-09-09 | Discharge: 2017-09-09 | Disposition: A | Discharge status: Home / Self Care | Payer: No Typology Code available for payment source | Attending: Emergency Medicine | Admitting: Emergency Medicine

## 2017-09-09 ENCOUNTER — Encounter (HOSPITAL_BASED_OUTPATIENT_CLINIC_OR_DEPARTMENT_OTHER): Payer: Self-pay | Admitting: Emergency Medicine

## 2017-09-09 DIAGNOSIS — M545 Low back pain, unspecified: Secondary | ICD-10-CM

## 2017-09-09 DIAGNOSIS — M419 Scoliosis, unspecified: Secondary | ICD-10-CM | POA: Insufficient documentation

## 2017-09-09 DIAGNOSIS — R0789 Other chest pain: Secondary | ICD-10-CM | POA: Diagnosis not present

## 2017-09-09 DIAGNOSIS — R079 Chest pain, unspecified: Secondary | ICD-10-CM | POA: Diagnosis present

## 2017-09-09 DIAGNOSIS — Z79899 Other long term (current) drug therapy: Secondary | ICD-10-CM | POA: Insufficient documentation

## 2017-09-09 MED ORDER — IBUPROFEN 800 MG PO TABS
800.0000 mg | ORAL_TABLET | Freq: Once | ORAL | Status: AC
  Administered 2017-09-09: 800 mg via ORAL
  Filled 2017-09-09: qty 1
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
## 2017-09-09 NOTE — ED Notes (Signed)
Pt is in XR
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
## 2017-09-09 NOTE — Discharge Instructions (Signed)
You may alternate Tylenol 1000 mg every 6 hours as needed for pain and Ibuprofen 800 mg every 8 hours as needed for pain.  Please take Ibuprofen with food. ° °
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
## 2017-10-03 ENCOUNTER — Emergency Department (HOSPITAL_BASED_OUTPATIENT_CLINIC_OR_DEPARTMENT_OTHER)
Admission: EM | Admit: 2017-10-03 | Discharge: 2017-10-03 | Disposition: A | Discharge status: Home / Self Care | Payer: Self-pay | Attending: Emergency Medicine | Admitting: Emergency Medicine

## 2017-10-03 ENCOUNTER — Encounter (HOSPITAL_BASED_OUTPATIENT_CLINIC_OR_DEPARTMENT_OTHER): Payer: Self-pay | Admitting: Emergency Medicine

## 2017-10-03 ENCOUNTER — Other Ambulatory Visit: Payer: Self-pay

## 2017-10-03 DIAGNOSIS — Z79899 Other long term (current) drug therapy: Secondary | ICD-10-CM | POA: Insufficient documentation

## 2017-10-03 DIAGNOSIS — Y939 Activity, unspecified: Secondary | ICD-10-CM | POA: Insufficient documentation

## 2017-10-03 DIAGNOSIS — M549 Dorsalgia, unspecified: Secondary | ICD-10-CM | POA: Insufficient documentation

## 2017-10-03 DIAGNOSIS — S8002XA Contusion of left knee, initial encounter: Secondary | ICD-10-CM | POA: Insufficient documentation

## 2017-10-03 DIAGNOSIS — Y92410 Unspecified street and highway as the place of occurrence of the external cause: Secondary | ICD-10-CM | POA: Insufficient documentation

## 2017-10-03 DIAGNOSIS — Y999 Unspecified external cause status: Secondary | ICD-10-CM | POA: Insufficient documentation

## 2017-10-03 MED ORDER — CYCLOBENZAPRINE HCL 10 MG PO TABS: 10.0000 mg | ORAL_TABLET | Freq: Every evening | ORAL | 0 refills | Status: DC | PRN
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
## 2017-10-03 NOTE — ED Notes (Signed)
ED Provider at bedside. 
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
## 2017-10-03 NOTE — ED Notes (Signed)
Unable to assess pt due to pt being on the phone with insurance co.

## 2017-10-03 NOTE — Discharge Instructions (Signed)

## 2019-08-03 ENCOUNTER — Emergency Department (HOSPITAL_BASED_OUTPATIENT_CLINIC_OR_DEPARTMENT_OTHER)
Admission: EM | Admit: 2019-08-03 | Discharge: 2019-08-04 | Disposition: A | Discharge status: Home / Self Care | Payer: Self-pay | Attending: Emergency Medicine | Admitting: Emergency Medicine

## 2019-08-03 ENCOUNTER — Encounter (HOSPITAL_BASED_OUTPATIENT_CLINIC_OR_DEPARTMENT_OTHER): Payer: Self-pay | Admitting: Emergency Medicine

## 2019-08-03 ENCOUNTER — Other Ambulatory Visit: Payer: Self-pay

## 2019-08-03 DIAGNOSIS — N23 Unspecified renal colic: Secondary | ICD-10-CM

## 2019-08-03 DIAGNOSIS — E876 Hypokalemia: Secondary | ICD-10-CM | POA: Insufficient documentation

## 2019-08-03 DIAGNOSIS — Z79899 Other long term (current) drug therapy: Secondary | ICD-10-CM | POA: Insufficient documentation

## 2019-08-03 DIAGNOSIS — N201 Calculus of ureter: Secondary | ICD-10-CM | POA: Insufficient documentation

## 2019-08-03 DIAGNOSIS — N1339 Other hydronephrosis: Secondary | ICD-10-CM | POA: Insufficient documentation

## 2019-08-03 NOTE — ED Triage Notes (Signed)
LLQ abd pain. Sudden onset 20 minutes ago PT writhing, emesis. Times 2.

## 2019-08-04 ENCOUNTER — Emergency Department (HOSPITAL_BASED_OUTPATIENT_CLINIC_OR_DEPARTMENT_OTHER): Payer: Self-pay

## 2019-08-04 LAB — CBC
HCT: 44.3 % (ref 39.0–52.0)
Hemoglobin: 14.8 g/dL (ref 13.0–17.0)
MCH: 31 pg (ref 26.0–34.0)
MCHC: 33.4 g/dL (ref 30.0–36.0)
MCV: 92.7 fL (ref 80.0–100.0)
Platelets: 243 10*3/uL (ref 150–400)
RBC: 4.78 MIL/uL (ref 4.22–5.81)
RDW: 11.8 % (ref 11.5–15.5)
WBC: 8 10*3/uL (ref 4.0–10.5)
nRBC: 0 % (ref 0.0–0.2)

## 2019-08-04 LAB — COMPREHENSIVE METABOLIC PANEL
ALT: 16 U/L (ref 0–44)
AST: 25 U/L (ref 15–41)
Albumin: 4.3 g/dL (ref 3.5–5.0)
Alkaline Phosphatase: 54 U/L (ref 38–126)
Anion gap: 12 (ref 5–15)
BUN: 14 mg/dL (ref 6–20)
CO2: 23 mmol/L (ref 22–32)
Calcium: 9.3 mg/dL (ref 8.9–10.3)
Chloride: 103 mmol/L (ref 98–111)
Creatinine, Ser: 1.05 mg/dL (ref 0.61–1.24)
GFR calc Af Amer: >60 mL/min (ref >60)
GFR calc non Af Amer: >60 mL/min (ref >60)
Glucose, Bld: 118 mg/dL — ABNORMAL HIGH (ref 70–99)
Potassium: 2.8 mmol/L — ABNORMAL LOW (ref 3.5–5.1)
Sodium: 138 mmol/L (ref 135–145)
Total Bilirubin: 0.6 mg/dL (ref 0.3–1.2)
Total Protein: 7.6 g/dL (ref 6.5–8.1)

## 2019-08-04 LAB — URINALYSIS, ROUTINE W REFLEX MICROSCOPIC
Bilirubin Urine: NEGATIVE
Glucose, UA: NEGATIVE mg/dL
Hgb urine dipstick: NEGATIVE
Ketones, ur: NEGATIVE mg/dL
Leukocytes,Ua: NEGATIVE
Nitrite: NEGATIVE
Protein, ur: NEGATIVE mg/dL
Specific Gravity, Urine: 1.02 (ref 1.005–1.030)
pH: >9 — ABNORMAL HIGH (ref 5.0–8.0)

## 2019-08-04 LAB — LIPASE, BLOOD: Lipase: 33 U/L (ref 11–51)

## 2019-08-04 MED ORDER — HYDROCODONE-ACETAMINOPHEN 5-325 MG PO TABS: 1.0000 | ORAL_TABLET | Freq: Four times a day (QID) | ORAL | 0 refills | Status: DC | PRN

## 2019-08-04 MED ORDER — KETOROLAC TROMETHAMINE 30 MG/ML IJ SOLN
30.0000 mg | Freq: Once | INTRAMUSCULAR | Status: AC
  Administered 2019-08-04: 30 mg via INTRAVENOUS
  Filled 2019-08-04: qty 1

## 2019-08-04 MED ORDER — KETOROLAC TROMETHAMINE 30 MG/ML IJ SOLN
15.0000 mg | Freq: Once | INTRAMUSCULAR | Status: AC
  Administered 2019-08-04: 15 mg via INTRAVENOUS
  Filled 2019-08-04: qty 1

## 2019-08-04 MED ORDER — ONDANSETRON HCL 4 MG/2ML IJ SOLN
INTRAMUSCULAR | Status: AC
  Filled 2019-08-04: qty 2

## 2019-08-04 MED ORDER — PROMETHAZINE HCL 25 MG/ML IJ SOLN
12.5000 mg | Freq: Once | INTRAMUSCULAR | Status: AC
  Administered 2019-08-04: 12.5 mg via INTRAVENOUS
  Filled 2019-08-04: qty 1

## 2019-08-04 MED ORDER — FENTANYL CITRATE (PF) 100 MCG/2ML IJ SOLN
100.0000 ug | Freq: Once | INTRAMUSCULAR | Status: AC
  Administered 2019-08-04: 100 ug via INTRAVENOUS
  Filled 2019-08-04: qty 2

## 2019-08-04 MED ORDER — ONDANSETRON HCL 4 MG/2ML IJ SOLN
4.0000 mg | Freq: Once | INTRAMUSCULAR | Status: AC | PRN
  Administered 2019-08-04: 4 mg via INTRAVENOUS

## 2019-08-04 MED ORDER — POTASSIUM CHLORIDE CRYS ER 20 MEQ PO TBCR
40.0000 meq | EXTENDED_RELEASE_TABLET | Freq: Once | ORAL | Status: AC
  Administered 2019-08-04: 40 meq via ORAL
  Filled 2019-08-04: qty 2

## 2019-08-04 MED ORDER — ONDANSETRON HCL 4 MG/2ML IJ SOLN
4.0000 mg | Freq: Once | INTRAMUSCULAR | Status: AC
Start: 2019-08-04 — End: 2019-08-04
  Administered 2019-08-04: 4 mg via INTRAVENOUS
  Filled 2019-08-04: qty 2

## 2019-08-04 MED ORDER — HYDROMORPHONE HCL 1 MG/ML IJ SOLN
1.0000 mg | Freq: Once | INTRAMUSCULAR | Status: AC
  Administered 2019-08-04: 01:00:00 1 mg via INTRAVENOUS
  Filled 2019-08-04: qty 1

## 2019-08-04 MED ORDER — ONDANSETRON HCL 4 MG/2ML IJ SOLN
4.0000 mg | Freq: Once | INTRAMUSCULAR | Status: AC
  Administered 2019-08-04: 4 mg via INTRAVENOUS
  Filled 2019-08-04: qty 2

## 2019-08-04 MED ORDER — IOHEXOL 300 MG/ML  SOLN
100.0000 mL | Freq: Once | INTRAMUSCULAR | Status: AC
  Administered 2019-08-04: 01:00:00 100 mL via INTRAVENOUS

## 2019-08-04 MED ORDER — SODIUM CHLORIDE 0.9 % IV BOLUS (SEPSIS)
1000.0000 mL | Freq: Once | INTRAVENOUS | Status: AC
  Administered 2019-08-04: 1000 mL via INTRAVENOUS

## 2019-08-04 MED ORDER — ONDANSETRON 8 MG PO TBDP: 8.0000 mg | ORAL_TABLET | Freq: Three times a day (TID) | ORAL | 0 refills | Status: DC | PRN

## 2019-08-04 MED ORDER — SODIUM CHLORIDE 0.9 % IV BOLUS (SEPSIS)
1000.0000 mL | Freq: Once | INTRAVENOUS | Status: AC
  Administered 2019-08-04: 01:00:00 1000 mL via INTRAVENOUS

## 2019-08-04 NOTE — ED Notes (Signed)
Pt denies nausea, resting with eyes closed. PO fluids given per MD.

## 2019-08-04 NOTE — ED Notes (Signed)
ED Provider at bedside. 

## 2019-08-04 NOTE — ED Provider Notes (Signed)
[facility] EMERGENCY DEPARTMENT Provider Note   CSN: [id number] Arrival date & time: 08/03/19  2324     History Chief Complaint  Patient presents with  . Abdominal Pain   Level 5 caveat due to acuity of condition Edward Bray is a 24 y.o. male.  The history is provided by the patient.  Abdominal Pain Pain location:  LLQ Pain quality: sharp   Pain severity:  Severe Onset quality:  Sudden Timing:  Constant Progression:  Worsening Chronicity:  New Relieved by:  Nothing Worsened by:  Nothing Associated symptoms: diarrhea, nausea and vomiting   Patient reports he ate a dinner with lasagna and soon after he had sudden onset of lower abdominal pain and vomiting and episode of diarrhea. Patient reports he has never had this pain before. He is unable to provide any other details due to severe pain     Past Medical History:  Diagnosis Date  . Scoliosis     There are no problems to display for this patient.   History reviewed. No pertinent surgical history.     No family history on file.  Social History   Tobacco Use  . Smoking status: Never Smoker  . Smokeless tobacco: Never Used  Substance Use Topics  . Alcohol use: Yes    Comment: social   . Drug use: Yes    Types: Marijuana    Comment: last week.    Home Medications Prior to Admission medications   Medication Sig Start Date End Date Taking? Authorizing Provider  cyclobenzaprine (FLEXERIL) 10 MG tablet Take 1 tablet (10 mg total) by mouth at bedtime as needed for muscle spasms. 10/03/17   Robinson, [place] N, PA-C  diphenoxylate-atropine (LOMOTIL) 2.5-0.025 MG per tablet Take 2 tablets by mouth 4 (four) times daily as needed for diarrhea or loose stools. 07/08/14   Lorre Nick, MD  HYDROcodone-acetaminophen (NORCO/VICODIN) 5-325 MG per tablet Take 1 tablet by mouth every 6 (six) hours as needed. 10/25/14   Gwyneth Sprout, MD  ibuprofen (ADVIL,MOTRIN) 600 MG tablet Take 1 tablet (600 mg total) by  mouth every 8 (eight) hours as needed for pain. Take with food. 03/28/12   Cathren Laine, MD  naproxen (NAPROSYN) 500 MG tablet Take 1 tablet (500 mg total) by mouth 2 (two) times daily. 10/25/14   Gwyneth Sprout, MD  ondansetron (ZOFRAN ODT) 8 MG disintegrating tablet Take 1 tablet (8 mg total) by mouth every 8 (eight) hours as needed for nausea or vomiting. 07/08/14   Lorre Nick, MD  promethazine (PHENERGAN) 25 MG tablet Take 1 tablet (25 mg total) by mouth every 6 (six) hours as needed for nausea or vomiting. 05/21/14   Tilden Fossa, MD    Allergies    Patient has no known allergies.  Review of Systems   Review of Systems  Unable to perform ROS: Acuity of condition  Gastrointestinal: Positive for abdominal pain, diarrhea, nausea and vomiting.    Physical Exam Updated Vital Signs BP 115/68 (BP Location: Left Arm)   Pulse 72   Temp 97.8 F (36.6 C) (Oral)   Resp 16   Wt 81.6 kg   SpO2 97%   BMI 26.57 kg/m   Physical Exam CONSTITUTIONAL: Well developed/well nourished, anxious and writhing in the bed HEAD: Normocephalic/atraumatic EYES: EOMI/PERRL ENMT: Mucous membranes moist NECK: supple no meningeal signs SPINE/BACK:entire spine nontender CV: S1/S2 noted, no murmurs/rubs/gallops noted LUNGS: Lungs are clear to auscultation bilaterally, no apparent distress ABDOMEN: soft, diffuse tenderness noted, with significant tenderness in left  lower quadrant. GU: Testicles descended bilaterally, no hernia noted, no focal testicular tenderness NEURO: Pt is awake/alert/appropriate, moves all extremitiesx4.  No facial droop.   EXTREMITIES: pulses normal/equal, full ROM SKIN: warm, color normal PSYCH: Anxious and hyperventilating  ED Results / Procedures / Treatments   Labs (all labs ordered are listed, but only abnormal results are displayed) Labs Reviewed  COMPREHENSIVE METABOLIC PANEL - Abnormal; Notable for the following components:      Result Value   Potassium 2.8 (*)     Glucose, Bld 118 (*)    All other components within normal limits  URINALYSIS, ROUTINE W REFLEX MICROSCOPIC - Abnormal; Notable for the following components:   pH >9.0 (*)    All other components within normal limits  LIPASE, BLOOD  CBC    EKG None  Radiology CT ABDOMEN PELVIS W CONTRAST  Result Date: 08/04/2019 CLINICAL DATA:  Nausea and vomiting, left lower quadrant abdominal pain with sudden onset 20 minutes prior EXAM: CT ABDOMEN AND PELVIS WITH CONTRAST TECHNIQUE: Multidetector CT imaging of the abdomen and pelvis was performed using the standard protocol following bolus administration of intravenous contrast. CONTRAST:  OMNIPAQUE IOHEXOL 300 MG/ML  SOLN COMPARISON:  CT abdomen pelvis Oct 12, 2008 FINDINGS: Lower chest: Lung bases are clear. Normal heart size. No pericardial effusion. Hepatobiliary: Superior most portion of the hepatic dome is partially collimated. No focal liver abnormality is seen. No gallstones, gallbladder wall thickening, or biliary dilatation. Pancreas: Unremarkable. No pancreatic ductal dilatation or surrounding inflammatory changes. Spleen: Normal in size without focal abnormality. Adrenals/Urinary Tract: Normal adrenal glands. Kidneys are imaged in an excretory phase as patient motion necessitated a second attempt at acquisition resulting in a delayed phase of contrast. Notably, there is mild left hydroureteronephrosis to the level of a 2 mm calculus positioned at the left ureterovesicular junction with resulting delayed left nephrogram and excretion. Layering excreted contrast material noted within the bladder lumen. Urinary bladder is otherwise unremarkable Stomach/Bowel: Distal esophagus, stomach and duodenal sweep are unremarkable. No small bowel wall thickening or dilatation. No evidence of obstruction. A normal appendix is visualized. No colonic dilatation or wall thickening. Vascular/Lymphatic: The aorta is normal caliber. No suspicious or enlarged lymph  nodes in the included lymphatic chains. Reproductive: The prostate and seminal vesicles are unremarkable. Other: No free fluid. No free air. No bowel containing hernias. Musculoskeletal: No acute osseous abnormality or suspicious osseous lesion. IMPRESSION: Mild left hydroureteronephrosis to the level of a 2 mm calculus positioned at the left ureterovesicular junction. Resulting delayed excretion the left kidney is a physiologic response to obstruction. Electronically Signed   By: Kreg Shropshire M.D.   On: 08/04/2019 01:46    Procedures Procedures Medications Ordered in ED Medications  ondansetron (ZOFRAN) injection 4 mg (4 mg Intravenous Given 08/04/19 0004)  fentaNYL (SUBLIMAZE) injection 100 mcg (100 mcg Intravenous Given 08/04/19 0015)  ondansetron (ZOFRAN) injection 4 mg (4 mg Intravenous Given 08/04/19 0108)  sodium chloride 0.9 % bolus 1,000 mL (0 mLs Intravenous Stopped 08/04/19 0158)  HYDROmorphone (DILAUDID) injection 1 mg (1 mg Intravenous Given 08/04/19 0122)  sodium chloride 0.9 % bolus 1,000 mL (0 mLs Intravenous Stopped 08/04/19 0253)  ondansetron (ZOFRAN) injection 4 mg (4 mg Intravenous Given 08/04/19 0142)  iohexol (OMNIPAQUE) 300 MG/ML solution 100 mL (100 mLs Intravenous Contrast Given 08/04/19 0126)  ketorolac (TORADOL) 30 MG/ML injection 30 mg (30 mg Intravenous Given 08/04/19 0204)  potassium chloride SA (KLOR-CON) CR tablet 40 mEq (40 mEq Oral Given 08/04/19 0308)  ketorolac (TORADOL) 30 MG/ML injection 15 mg (15 mg Intravenous Given 08/04/19 0350)  promethazine (PHENERGAN) injection 12.5 mg (12.5 mg Intravenous Given 08/04/19 0350)    ED Course  I have reviewed the triage vital signs and the nursing notes.  Pertinent labs & imaging results that were available during my care of the patient were reviewed by me and considered in my medical decision making (see chart for details).    MDM Rules/Calculators/A&P                      1:48 AM Patient presents for sudden onset of  abdominal pain.  Patient is very uncomfortable writhing in the bed and will intermittently scream out in pain.  His exam is very challenging due to severe pain.  However it does appear that his pain is localized in the left lower quadrant Stat CT abdomen pelvis has been ordered 2:29 AM CT imaging reveals left ureteral stone which would explain his pain.  Patient does appear to be improving at this time. We will continue to monitor 3:03 AM Pt resting comfortably Mild HYPOkalemia, will order oral potassium 5:20 AM Patient require another round of pain medicine and antiemetics.  He is now feeling improved He is resting comfortably.,  Vitals appropriate At this point he is appropriate for discharge home. He also had mild hypokalemia, advised this will likely correct with normal diet He will be referred to urology. Final Clinical Impression(s) / ED Diagnoses Final diagnoses:  Ureteral colic  Hypokalemia    Rx / DC Orders ED Discharge Orders         Ordered    ondansetron (ZOFRAN ODT) 8 MG disintegrating tablet  Every 8 hours PRN     08/04/19 0514    HYDROcodone-acetaminophen (NORCO/VICODIN) 5-325 MG tablet  Every 6 hours PRN     08/04/19 0517           Ripley Fraise, MD 08/04/19 [phone]

## 2019-08-04 NOTE — ED Notes (Signed)
PT appears drowsy, no emesis noted. PT to CT scan.

## 2019-08-04 NOTE — ED Notes (Signed)
PT with small emesis

## 2020-02-03 ENCOUNTER — Encounter (HOSPITAL_BASED_OUTPATIENT_CLINIC_OR_DEPARTMENT_OTHER): Payer: Self-pay | Admitting: Emergency Medicine

## 2020-02-03 ENCOUNTER — Emergency Department (HOSPITAL_BASED_OUTPATIENT_CLINIC_OR_DEPARTMENT_OTHER)
Admission: EM | Admit: 2020-02-03 | Discharge: 2020-02-03 | Disposition: A | Discharge status: Home / Self Care | Payer: Self-pay | Attending: Emergency Medicine | Admitting: Emergency Medicine

## 2020-02-03 ENCOUNTER — Other Ambulatory Visit: Payer: Self-pay

## 2020-02-03 DIAGNOSIS — R109 Unspecified abdominal pain: Secondary | ICD-10-CM

## 2020-02-03 DIAGNOSIS — R1031 Right lower quadrant pain: Secondary | ICD-10-CM | POA: Insufficient documentation

## 2020-02-03 DIAGNOSIS — R1110 Vomiting, unspecified: Secondary | ICD-10-CM | POA: Insufficient documentation

## 2020-02-03 LAB — URINALYSIS, ROUTINE W REFLEX MICROSCOPIC
Bilirubin Urine: NEGATIVE
Glucose, UA: NEGATIVE mg/dL
Ketones, ur: NEGATIVE mg/dL
Leukocytes,Ua: NEGATIVE
Nitrite: NEGATIVE
Protein, ur: NEGATIVE mg/dL
Specific Gravity, Urine: 1.025 (ref 1.005–1.030)
pH: 6.5 (ref 5.0–8.0)

## 2020-02-03 LAB — URINALYSIS, MICROSCOPIC (REFLEX): RBC / HPF: >50 RBC/hpf (ref 0–5)

## 2020-02-03 MED ORDER — ONDANSETRON 4 MG PO TBDP
8.0000 mg | ORAL_TABLET | Freq: Once | ORAL | Status: AC
  Administered 2020-02-03: 8 mg via ORAL
  Filled 2020-02-03: qty 2

## 2020-02-03 MED ORDER — HYDROCODONE-ACETAMINOPHEN 5-325 MG PO TABS
2.0000 | ORAL_TABLET | Freq: Once | ORAL | Status: AC
  Administered 2020-02-03: 2 via ORAL
  Filled 2020-02-03: qty 2

## 2020-02-03 NOTE — ED Provider Notes (Signed)
[facility] EMERGENCY DEPARTMENT Provider Note   CSN: [id number] Arrival date & time: 02/03/20  0114     History Chief Complaint  Patient presents with  . Abdominal Pain    Edward Bray is a 24 y.o. male.  The history is provided by the patient.  Abdominal Pain Pain location:  R flank and RLQ Pain severity:  Moderate Onset quality:  Sudden Timing:  Constant Progression:  Resolved Chronicity:  Recurrent Relieved by: Pain medication. Worsened by:  Movement Associated symptoms: vomiting   Associated symptoms: no fever   Patient previous history of kidney stones presents with right flank/abdominal pain He reports sudden onset of pain approximately 4 hours prior to arrival.  Similar to previous history of kidney stone.  Pain starts in the right flank and moved to the right lower abdomen     Past Medical History:  Diagnosis Date  . Scoliosis     There are no problems to display for this patient.   History reviewed. No pertinent surgical history.     History reviewed. No pertinent family history.  Social History   Tobacco Use  . Smoking status: Never Smoker  . Smokeless tobacco: Never Used  Substance Use Topics  . Alcohol use: Yes    Comment: social   . Drug use: Yes    Types: Marijuana    Comment: last week.    Home Medications Prior to Admission medications   Medication Sig Start Date End Date Taking? Authorizing Provider  promethazine (PHENERGAN) 25 MG tablet Take 1 tablet (25 mg total) by mouth every 6 (six) hours as needed for nausea or vomiting. 05/21/14 08/04/19  Tilden Fossa, MD    Allergies    Patient has no known allergies.  Review of Systems   Review of Systems  Constitutional: Negative for fever.  Gastrointestinal: Positive for abdominal pain and vomiting.  Genitourinary: Positive for flank pain.  All other systems reviewed and are negative.   Physical Exam Updated Vital Signs BP (!) 120/95   Pulse 65   Temp 98.7 F  (37.1 C) (Oral)   Resp 16   Ht 1.753 m (5\' 9" )   Wt 82 kg   SpO2 100%   BMI 26.70 kg/m   Physical Exam CONSTITUTIONAL: Well developed/well nourished HEAD: Normocephalic/atraumatic EYES: EOMI ENMT: Mucous membranes moist NECK: supple no meningeal signs SPINE/BACK:entire spine nontender CV: S1/S2 noted, no murmurs/rubs/gallops noted LUNGS: Lungs are clear to auscultation bilaterally, no apparent distress ABDOMEN: soft, nontender, no rebound or guarding, bowel sounds noted throughout abdomen GU:no cva tenderness NEURO: Pt is awake/alert/appropriate, moves all extremitiesx4.  No facial droop.   EXTREMITIES: pulses normal/equal, full ROM SKIN: warm, color normal PSYCH: no abnormalities of mood noted, alert and oriented to situation  ED Results / Procedures / Treatments   Labs (all labs ordered are listed, but only abnormal results are displayed) Labs Reviewed  URINALYSIS, ROUTINE W REFLEX MICROSCOPIC - Abnormal; Notable for the following components:      Result Value   APPearance HAZY (*)    Hgb urine dipstick LARGE (*)    All other components within normal limits  URINALYSIS, MICROSCOPIC (REFLEX) - Abnormal; Notable for the following components:   Bacteria, UA MANY (*)    All other components within normal limits    EKG None  Radiology No results found.  Procedures Procedures  Medications Ordered in ED Medications  ondansetron (ZOFRAN-ODT) disintegrating tablet 8 mg (8 mg Oral Given 02/03/20 0333)  HYDROcodone-acetaminophen (NORCO/VICODIN) 5-325 MG per tablet  2 tablet (2 tablets Oral Given 02/03/20 2956)    ED Course  I have reviewed the triage vital signs and the nursing notes.  Pertinent labs  results that were available during my care of the patient were reviewed by me and considered in my medical decision making (see chart for details).    MDM Rules/Calculators/A&P                          Patient presents with right flank/abdominal pain similar to prior  episodes of kidney stones. By the time of my evaluation patient's pain was completely gone.  His urine did have hematuria, he is likely since passed a stone.  Patient had CT imaging this past year that revealed a kidney stone. Patient overall is well-appearing and is requesting discharge home Final Clinical Impression(s) / ED Diagnoses Final diagnoses:  Flank pain    Rx / DC Orders ED Discharge Orders    None       Zadie Rhine, MD 02/03/20 [phone]

## 2020-02-03 NOTE — ED Notes (Signed)
Spoke with MD regarding pt with a hx of kidney stone and blood in urine noted. Orders received.

## 2020-02-03 NOTE — ED Triage Notes (Signed)
Patient presents with complaints of right side abd pain with emesis x 1 and diarrhea multiple times; states onset 2100 last pm. NAD noted; ambulatory with steady gait.

## 2021-01-13 ENCOUNTER — Emergency Department (HOSPITAL_BASED_OUTPATIENT_CLINIC_OR_DEPARTMENT_OTHER)
Admission: EM | Admit: 2021-01-13 | Discharge: 2021-01-13 | Disposition: A | Discharge status: Home / Self Care | Payer: Self-pay | Attending: Emergency Medicine | Admitting: Emergency Medicine

## 2021-01-13 ENCOUNTER — Encounter (HOSPITAL_BASED_OUTPATIENT_CLINIC_OR_DEPARTMENT_OTHER): Payer: Self-pay | Admitting: Emergency Medicine

## 2021-01-13 ENCOUNTER — Other Ambulatory Visit: Payer: Self-pay

## 2021-01-13 ENCOUNTER — Emergency Department (HOSPITAL_BASED_OUTPATIENT_CLINIC_OR_DEPARTMENT_OTHER): Payer: Self-pay

## 2021-01-13 DIAGNOSIS — U071 COVID-19: Secondary | ICD-10-CM | POA: Insufficient documentation

## 2021-01-13 DIAGNOSIS — R059 Cough, unspecified: Secondary | ICD-10-CM

## 2021-01-13 LAB — RESP PANEL BY RT-PCR (FLU A&B, COVID) ARPGX2
Influenza A by PCR: NEGATIVE
Influenza B by PCR: NEGATIVE
SARS Coronavirus 2 by RT PCR: POSITIVE — AB

## 2021-01-13 MED ORDER — ACETAMINOPHEN 500 MG PO TABS
1000.0000 mg | ORAL_TABLET | Freq: Once | ORAL | Status: AC
  Administered 2021-01-13: 1000 mg via ORAL
  Filled 2021-01-13: qty 2

## 2021-01-13 MED ORDER — ONDANSETRON 4 MG PO TBDP: 4.0000 mg | ORAL_TABLET | Freq: Three times a day (TID) | ORAL | 0 refills | Status: AC | PRN

## 2021-01-13 NOTE — ED Provider Notes (Signed)
[facility] EMERGENCY DEPARTMENT Provider Note  CSN: [id number] Arrival date & time: 01/13/21 0456  Chief Complaint(s) URI  HPI [facility] is a 25 y.o. male    URI Presenting symptoms: congestion, cough, fatigue, fever, rhinorrhea and sore throat   Severity:  Moderate Onset quality:  Gradual Duration:  1 day Timing:  Constant Progression:  Worsening Chronicity:  New Relieved by:  Nothing Worsened by:  Nothing Associated symptoms: myalgias   Risk factors: sick contacts (COVID - at work)    Past Medical History Past Medical History:  Diagnosis Date   Scoliosis    There are no problems to display for this patient.  Home Medication(s) Prior to Admission medications   Medication Sig Start Date End Date Taking? Authorizing Provider  ondansetron (ZOFRAN ODT) 4 MG disintegrating tablet Take 1 tablet (4 mg total) by mouth every 8 (eight) hours as needed for up to 3 days for nausea or vomiting. 01/13/21 01/16/21 Yes Maybell Misenheimer, Amadeo Garnet, MD  promethazine (PHENERGAN) 25 MG tablet Take 1 tablet (25 mg total) by mouth every 6 (six) hours as needed for nausea or vomiting. 05/21/14 08/04/19  Tilden Fossa, MD                                                                                                                                    Past Surgical History History reviewed. No pertinent surgical history. Family History History reviewed. No pertinent family history.  Social History Social History   Tobacco Use   Smoking status: Never   Smokeless tobacco: Never  Substance Use Topics   Alcohol use: Yes    Comment: social    Drug use: Yes    Types: Marijuana    Comment: last week.   Allergies Patient has no known allergies.  Review of Systems Review of Systems  Constitutional:  Positive for fatigue and fever.  HENT:  Positive for congestion, rhinorrhea and sore throat.   Respiratory:  Positive for cough.   Gastrointestinal:  Positive for diarrhea, nausea  and vomiting.  Musculoskeletal:  Positive for myalgias.  All other systems are reviewed and are negative for acute change except as noted in the HPI  Physical Exam Vital Signs  I have reviewed the triage vital signs BP 116/66   Pulse 93   Temp (!) 103.1 F (39.5 C) (Oral)   Resp 18   Ht 5\' 9"  (1.753 m)   Wt 81.6 kg   SpO2 98%   BMI 26.58 kg/m   Physical Exam Vitals reviewed.  Constitutional:      General: He is not in acute distress.    Appearance: He is well-developed. He is not diaphoretic.  HENT:     Head: Normocephalic and atraumatic.     Right Ear: Tympanic membrane normal.     Left Ear: Tympanic membrane normal.     Nose: Nose normal.     Mouth/Throat:  Pharynx: No oropharyngeal exudate or posterior oropharyngeal erythema.     Tonsils: No tonsillar exudate.  Eyes:     General: No scleral icterus.       Right eye: No discharge.        Left eye: No discharge.     Conjunctiva/sclera: Conjunctivae normal.     Pupils: Pupils are equal, round, and reactive to light.  Cardiovascular:     Rate and Rhythm: Normal rate and regular rhythm.     Heart sounds: No murmur heard.   No friction rub. No gallop.  Pulmonary:     Effort: Pulmonary effort is normal. No respiratory distress.     Breath sounds: Normal breath sounds. No stridor. No rales.  Abdominal:     General: There is no distension.     Palpations: Abdomen is soft.     Tenderness: There is no abdominal tenderness.  Musculoskeletal:        General: No tenderness.     Cervical back: Normal range of motion and neck supple.  Skin:    General: Skin is warm and dry.     Findings: No erythema or rash.  Neurological:     Mental Status: He is alert and oriented to person, place, and time.    ED Results and Treatments Labs (all labs ordered are listed, but only abnormal results are displayed) Labs Reviewed  RESP PANEL BY RT-PCR (FLU A&B, COVID) ARPGX2 - Abnormal; Notable for the following components:       Result Value   SARS Coronavirus 2 by RT PCR POSITIVE (*)    All other components within normal limits                                                                                                                         EKG  EKG Interpretation  Date/Time:    Ventricular Rate:    PR Interval:    QRS Duration:   QT Interval:    QTC Calculation:   R Axis:     Text Interpretation:         Radiology DG Chest 2 View  Result Date: 01/13/2021 CLINICAL DATA:  Fever and cough.  Body aches EXAM: CHEST - 2 VIEW COMPARISON:  09/09/2017 FINDINGS: The heart size and mediastinal contours are within normal limits. Both lungs are clear. Thoracic scoliosis scratch set thoracolumbar scoliosis. The visualized osseous structures appear intact. IMPRESSION: No active cardiopulmonary disease. Electronically Signed   By: Signa Kell M.D.   On: 01/13/2021 06:12    Pertinent labs & imaging results that were available during my care of the patient were reviewed by me and considered in my medical decision making (see MDM for details).  Medications Ordered in ED Medications  acetaminophen (TYLENOL) tablet 1,000 mg (1,000 mg Oral Given 01/13/21 0517)  Procedures Procedures  (including critical care time)  Medical Decision Making / ED Course I have reviewed the nursing notes for this encounter and the patient's prior records (if available in EHR or on provided paperwork).  [facility] was evaluated in Emergency Department on 01/13/2021 for the symptoms described in the history of present illness. He was evaluated in the context of the global COVID-19 pandemic, which necessitated consideration that the patient might be at risk for infection with the SARS-CoV-2 virus that causes COVID-19. Institutional protocols and algorithms that pertain to the evaluation of patients at risk  for COVID-19 are in a state of rapid change based on information released by regulatory bodies including the CDC and federal and state organizations. These policies and algorithms were followed during the patient's care in the ED.     Patient presents with viral symptoms for 1 day. adequate oral hydration. Rest of history as above.  Patient appears well. No signs of toxicity, patient is interactive. No hypoxia, tachypnea or other signs of respiratory distress. No sign of clinical dehydration. Lung exam clear. Rest of exam as above.  Chest x-ray negative for pneumonia. COVID test positive.   No evidence suggestive of pharyngitis, AOM, PNA, or meningitis.   Discussed symptomatic treatment with the patient and they will follow closely with their PCP.   Pertinent labs & imaging results that were available during my care of the patient were reviewed by me and considered in my medical decision making:    Final Clinical Impression(s) / ED Diagnoses Final diagnoses:  Cough  COVID-19 virus infection   The patient appears reasonably screened and/or stabilized for discharge and I doubt any other medical condition or other [facility] requiring further screening, evaluation, or treatment in the ED at this time prior to discharge. Safe for discharge with strict return precautions.  Disposition: Discharge  Condition: Good  I have discussed the results, Dx and Tx plan with the patient/family who expressed understanding and agree(s) with the plan. Discharge instructions discussed at length. The patient/family was given strict return precautions who verbalized understanding of the instructions. No further questions at time of discharge.    ED Discharge Orders          Ordered    ondansetron (ZOFRAN ODT) 4 MG disintegrating tablet  Every 8 hours PRN        01/13/21 0749             Follow Up: Primary care provider  Call  as needed    This chart was dictated using voice recognition  software.  Despite best efforts to proofread,  errors can occur which can change the documentation meaning.    Nira Conn, MD 01/13/21 [phone]

## 2021-01-13 NOTE — Discharge Instructions (Addendum)
You may take over-the-counter medicine for symptomatic relief, such as Tylenol, Motrin, TheraFlu, Alka seltzer , black elderberry, etc. Please limit acetaminophen (Tylenol) to 4000 mg and Ibuprofen (Motrin, Advil, etc.) to 2400 mg for a 24hr period. Please note that other over-the-counter medicine may contain acetaminophen or ibuprofen as a component of their ingredients.   

## 2021-01-13 NOTE — ED Triage Notes (Signed)
Pt with fever, cough, body aches starting tonight.

## 2021-09-17 ENCOUNTER — Emergency Department (HOSPITAL_BASED_OUTPATIENT_CLINIC_OR_DEPARTMENT_OTHER): Payer: Self-pay

## 2021-09-17 ENCOUNTER — Emergency Department (HOSPITAL_BASED_OUTPATIENT_CLINIC_OR_DEPARTMENT_OTHER)
Admission: EM | Admit: 2021-09-17 | Discharge: 2021-09-17 | Disposition: A | Discharge status: Home / Self Care | Payer: Self-pay | Attending: Emergency Medicine | Admitting: Emergency Medicine

## 2021-09-17 ENCOUNTER — Other Ambulatory Visit: Payer: Self-pay

## 2021-09-17 ENCOUNTER — Encounter (HOSPITAL_BASED_OUTPATIENT_CLINIC_OR_DEPARTMENT_OTHER): Payer: Self-pay | Admitting: Emergency Medicine

## 2021-09-17 DIAGNOSIS — M5417 Radiculopathy, lumbosacral region: Secondary | ICD-10-CM | POA: Insufficient documentation

## 2021-09-17 LAB — URINALYSIS, MICROSCOPIC (REFLEX)

## 2021-09-17 LAB — URINALYSIS, ROUTINE W REFLEX MICROSCOPIC
Bilirubin Urine: NEGATIVE
Glucose, UA: NEGATIVE mg/dL
Ketones, ur: NEGATIVE mg/dL
Leukocytes,Ua: NEGATIVE
Nitrite: NEGATIVE
Protein, ur: NEGATIVE mg/dL
Specific Gravity, Urine: 1.02 (ref 1.005–1.030)
pH: 6 (ref 5.0–8.0)

## 2021-09-17 MED ORDER — METHOCARBAMOL 500 MG PO TABS
500.0000 mg | ORAL_TABLET | Freq: Once | ORAL | Status: AC
  Administered 2021-09-17: 500 mg via ORAL
  Filled 2021-09-17: qty 1

## 2021-09-17 MED ORDER — KETOROLAC TROMETHAMINE 60 MG/2ML IM SOLN
60.0000 mg | Freq: Once | INTRAMUSCULAR | Status: AC
  Administered 2021-09-17: 60 mg via INTRAMUSCULAR
  Filled 2021-09-17: qty 2

## 2021-09-17 MED ORDER — HYDROCODONE-ACETAMINOPHEN 5-325 MG PO TABS
2.0000 | ORAL_TABLET | Freq: Once | ORAL | Status: AC
  Administered 2021-09-17: 2 via ORAL
  Filled 2021-09-17: qty 2

## 2021-09-17 MED ORDER — METHOCARBAMOL 500 MG PO TABS: 500.0000 mg | ORAL_TABLET | Freq: Two times a day (BID) | ORAL | 0 refills | Status: DC

## 2021-09-17 NOTE — ED Provider Notes (Signed)
?[facility] EMERGENCY DEPARTMENT ?Provider Note ? ? ?CSN: [id number] ?Arrival date & time: 09/17/21  0145 ? ?  ? ?History ? ?Chief Complaint  ?Patient presents with  ? Flank Pain  ? ? ?Edward Bray is a 26 y.o. male. ? ?The history is provided by the patient.  ?Flank Pain ?This is a new problem. The current episode started 12 to 24 hours ago. The problem occurs constantly. The problem has been gradually worsening. Pertinent negatives include no chest pain and no abdominal pain. Exacerbated by: Movement. The symptoms are relieved by rest.  ?Patient presents with right flank pain.  Reports this started after he was changing a tire.  The pain is in the right flank and moves down into his right leg.  No falls or trauma.  He reports at times he has pain in his right testicle.  No dysuria.  No fevers or vomiting. ?He reports previous history of scoliosis ?  ?No previous back surgery ?Home Medications ?Prior to Admission medications   ?Medication Sig Start Date End Date Taking? Authorizing Provider  ?methocarbamol (ROBAXIN) 500 MG tablet Take 1 tablet (500 mg total) by mouth 2 (two) times daily. 09/17/21  Yes Zadie Rhine, MD  ?promethazine (PHENERGAN) 25 MG tablet Take 1 tablet (25 mg total) by mouth every 6 (six) hours as needed for nausea or vomiting. 05/21/14 08/04/19  Tilden Fossa, MD  ?   ? ?Allergies    ?Patient has no known allergies.   ? ?Review of Systems   ?Review of Systems  ?Constitutional:  Negative for fever.  ?Cardiovascular:  Negative for chest pain.  ?Gastrointestinal:  Negative for abdominal pain and vomiting.  ?Genitourinary:  Positive for flank pain and testicular pain. Negative for dysuria.  ?Musculoskeletal:  Positive for back pain.  ?Neurological:  Negative for weakness and numbness.  ? ?Physical Exam ?Updated Vital Signs ?BP 129/85   Pulse 78   Temp 98.6 ?F (37 ?C) (Oral)   Resp 20   Ht 1.753 m (5\' 9" )   Wt 81.6 kg   SpO2 100%   BMI 26.58 kg/m?  ?Physical Exam ?CONSTITUTIONAL:  Well developed/well nourished, patient lying on his left side ?HEAD: Normocephalic/atraumatic ?EYES: EOMI/PERRL ?ENMT: Mucous membranes moist ?NECK: supple no meningeal signs ?SPINE/BACK:entire spine nontender no bruising/crepitance/stepoffs noted to spine ?CV: S1/S2 noted, no murmurs/rubs/gallops noted ?LUNGS: Lungs are clear to auscultation bilaterally, no apparent distress ?ABDOMEN: soft, nontender, no rebound or guarding ?GU: Right cva tenderness, no erythema or bruising ?Genital exam performed with chaperone (alex) present.  No scrotal erythema or crepitus, no testicular tenderness, testicles descended bilaterally, no inguinal hernia, no inguinal tenderness, no penile lesions or discharge ?NEURO: Awake/alert, no saddle anesthesia, equal motor 5/5 strength noted with the following: hip flexion/knee flexion/extension, foot dorsi/plantar flexion, great toe extension intact bilaterally, no clonus bilaterally, no sensory deficit in any dermatome.  EXTREMITIES: pulses normal, full ROM ?SKIN: warm, color normal ?PSYCH: no abnormalities of mood noted, alert and oriented to situation ? ?ED Results / Procedures / Treatments   ?Labs ?(all labs ordered are listed, but only abnormal results are displayed) ?Labs Reviewed  ?URINALYSIS, ROUTINE W REFLEX MICROSCOPIC - Abnormal; Notable for the following components:  ?    Result Value  ? Hgb urine dipstick TRACE (*)   ? All other components within normal limits  ?URINALYSIS, MICROSCOPIC (REFLEX) - Abnormal; Notable for the following components:  ? Bacteria, UA RARE (*)   ? Non Squamous Epithelial PRESENT (*)   ? All other components within  normal limits  ? ? ?EKG ?None ? ?Radiology ?DG Lumbar Spine Complete ? ?Result Date: 09/17/2021 ?CLINICAL DATA:  Lower back pain EXAM: LUMBAR SPINE - COMPLETE 4+ VIEW COMPARISON:  10/25/2014 FINDINGS: No evidence of fracture or bone lesion. Levoscoliosis that is progressed. No degenerative spurring or listhesis. IMPRESSION: Scoliosis without  acute superimposed finding. Electronically Signed   By: Tiburcio Pea M.D.   On: 09/17/2021 04:15   ? ?Procedures ?Procedures  ? ? ?Medications Ordered in ED ?Medications  ?methocarbamol (ROBAXIN) tablet 500 mg (has no administration in time range)  ?ketorolac (TORADOL) injection 60 mg (60 mg Intramuscular Given 09/17/21 0332)  ?HYDROcodone-acetaminophen (NORCO/VICODIN) 5-325 MG per tablet 2 tablet (2 tablets Oral Given 09/17/21 0333)  ? ? ?ED Course/ Medical Decision Making/ A&P ?Clinical Course as of 09/17/21 0439  ?Mon Sep 17, 2021  ?5643 Patient with right flank pain that radiates into his right leg as well as testicle pain.  On my exam his GU exam is overall unremarkable.  No signs of torsion [DW]  ?0436 Overall urinalysis and x-ray are negative.  Patient is ambulatory.  Reports most the pain just radiates into his right posterior thigh.  Strong suspicion for lumbosacral radiculopathy.  We will treat with conservative measures.  We discussed strict return precautions [DW]  ?  ?Clinical Course User Index ?[DW] Zadie Rhine, MD  ? ?                        ?Medical Decision Making ?Amount and/or Complexity of Data Reviewed ?Labs: ordered. ?Radiology: ordered. ? ?Risk ?Prescription drug management. ? ? ?This patient presents to the ED for concern of flank pain, this involves an extensive number of treatment options, and is a complaint that carries with it a high risk of complications and morbidity.  The differential diagnosis includes but is not limited to kidney stone, pyelonephritis, muscle strain, lumbosacral radiculopathy, testicular torsion ? ?Comorbidities that complicate the patient evaluation: ?Patient?s presentation is complicated by their history of scoliosis ? ? ?Lab Tests: ?I Ordered, and personally interpreted labs.  The pertinent results include: No signs of UTI ? ?Imaging Studies ordered: ?I ordered imaging studies including X-ray lumbar x-ray   ?I independently visualized and interpreted imaging  which showed no acute finding ?I agree with the radiologist interpretation ? ?Medicines ordered and prescription drug management: ?I ordered medication including Toradol and Vicodin for pain ?Reevaluation of the patient after these medicines showed that the patient    stayed the same ? ?Test Considered: ?No indication for MRI as patient is ambulatory no neurodeficit ? ?Reevaluation: ?After the interventions noted above, I reevaluated the patient and found that they have :stayed the same ? ?Complexity of problems addressed: ?Patient?s presentation is most consistent with  acute presentation with potential threat to life or bodily function ? ?Disposition: ?After consideration of the diagnostic results and the patient?s response to treatment,  ?I feel that the patent would benefit from discharge   .  ? ? ? ? ? ? ? ? ? ?Final Clinical Impression(s) / ED Diagnoses ?Final diagnoses:  ?Lumbosacral radiculopathy  ? ? ?Rx / DC Orders ?ED Discharge Orders   ? ?      Ordered  ?  methocarbamol (ROBAXIN) 500 MG tablet  2 times daily       ? 09/17/21 0437  ? ?  ?  ? ?  ? ? ?  ?Zadie Rhine, MD ?09/17/21 0440 ? ?

## 2021-09-17 NOTE — Discharge Instructions (Signed)

## 2021-09-17 NOTE — ED Notes (Signed)
Pt states that he has used the restroom already, pt states will he try to give urine sample in a few minutes. Pt is aware that urine sample is needed. ?

## 2021-09-17 NOTE — ED Triage Notes (Signed)
Pt reports R lower back pain that wraps to abdomen and radiates down R testicle and leg. Denies urinary sx. Pain started ~10 am, started out intermittent but now constant, rates pain 10/10. Pt states he was changing tires on his car. Denies any change to appearance of scrotum. Pt is ambulatory.  ?

## 2022-06-02 ENCOUNTER — Emergency Department (HOSPITAL_BASED_OUTPATIENT_CLINIC_OR_DEPARTMENT_OTHER): Payer: Self-pay

## 2022-06-02 ENCOUNTER — Emergency Department (HOSPITAL_BASED_OUTPATIENT_CLINIC_OR_DEPARTMENT_OTHER)
Admission: EM | Admit: 2022-06-02 | Discharge: 2022-06-02 | Disposition: A | Discharge status: Home / Self Care | Payer: Self-pay | Attending: Emergency Medicine | Admitting: Emergency Medicine

## 2022-06-02 ENCOUNTER — Encounter (HOSPITAL_BASED_OUTPATIENT_CLINIC_OR_DEPARTMENT_OTHER): Payer: Self-pay | Admitting: Emergency Medicine

## 2022-06-02 ENCOUNTER — Other Ambulatory Visit: Payer: Self-pay

## 2022-06-02 DIAGNOSIS — Z1152 Encounter for screening for COVID-19: Secondary | ICD-10-CM | POA: Insufficient documentation

## 2022-06-02 DIAGNOSIS — J101 Influenza due to other identified influenza virus with other respiratory manifestations: Secondary | ICD-10-CM | POA: Insufficient documentation

## 2022-06-02 LAB — RESP PANEL BY RT-PCR (RSV, FLU A&B, COVID)  RVPGX2
Influenza A by PCR: POSITIVE — AB
Influenza B by PCR: NEGATIVE
Resp Syncytial Virus by PCR: NEGATIVE
SARS Coronavirus 2 by RT PCR: NEGATIVE

## 2022-06-02 LAB — CBC WITH DIFFERENTIAL/PLATELET
Abs Immature Granulocytes: 0.01 10*3/uL (ref 0.00–0.07)
Basophils Absolute: 0 10*3/uL (ref 0.0–0.1)
Basophils Relative: 0 %
Eosinophils Absolute: 0 10*3/uL (ref 0.0–0.5)
Eosinophils Relative: 0 %
HCT: 44.8 % (ref 39.0–52.0)
Hemoglobin: 15.1 g/dL (ref 13.0–17.0)
Immature Granulocytes: 0 %
Lymphocytes Relative: 4 %
Lymphs Abs: 0.4 10*3/uL — ABNORMAL LOW (ref 0.7–4.0)
MCH: 31 pg (ref 26.0–34.0)
MCHC: 33.7 g/dL (ref 30.0–36.0)
MCV: 92 fL (ref 80.0–100.0)
Monocytes Absolute: 0.8 10*3/uL (ref 0.1–1.0)
Monocytes Relative: 9 %
Neutro Abs: 7.5 10*3/uL (ref 1.7–7.7)
Neutrophils Relative %: 87 %
Platelets: 246 10*3/uL (ref 150–400)
RBC: 4.87 MIL/uL (ref 4.22–5.81)
RDW: 11.8 % (ref 11.5–15.5)
WBC: 8.7 10*3/uL (ref 4.0–10.5)
nRBC: 0 % (ref 0.0–0.2)

## 2022-06-02 LAB — COMPREHENSIVE METABOLIC PANEL
ALT: 20 U/L (ref 0–44)
AST: 30 U/L (ref 15–41)
Albumin: 4.3 g/dL (ref 3.5–5.0)
Alkaline Phosphatase: 51 U/L (ref 38–126)
Anion gap: 10 (ref 5–15)
BUN: 10 mg/dL (ref 6–20)
CO2: 20 mmol/L — ABNORMAL LOW (ref 22–32)
Calcium: 9 mg/dL (ref 8.9–10.3)
Chloride: 103 mmol/L (ref 98–111)
Creatinine, Ser: 1.04 mg/dL (ref 0.61–1.24)
GFR, Estimated: >60 mL/min (ref >60)
Glucose, Bld: 115 mg/dL — ABNORMAL HIGH (ref 70–99)
Potassium: 3.2 mmol/L — ABNORMAL LOW (ref 3.5–5.1)
Sodium: 133 mmol/L — ABNORMAL LOW (ref 135–145)
Total Bilirubin: 0.7 mg/dL (ref 0.3–1.2)
Total Protein: 8.2 g/dL — ABNORMAL HIGH (ref 6.5–8.1)

## 2022-06-02 LAB — URINALYSIS, ROUTINE W REFLEX MICROSCOPIC
Bilirubin Urine: NEGATIVE
Glucose, UA: NEGATIVE mg/dL
Ketones, ur: NEGATIVE mg/dL
Leukocytes,Ua: NEGATIVE
Nitrite: NEGATIVE
Protein, ur: 30 mg/dL — AB
Specific Gravity, Urine: 1.015 (ref 1.005–1.030)
pH: >=9 (ref 5.0–8.0)

## 2022-06-02 LAB — LIPASE, BLOOD: Lipase: 35 U/L (ref 11–51)

## 2022-06-02 LAB — URINALYSIS, MICROSCOPIC (REFLEX)

## 2022-06-02 MED ORDER — ACETAMINOPHEN 325 MG PO TABS
650.0000 mg | ORAL_TABLET | Freq: Once | ORAL | Status: AC | PRN
  Administered 2022-06-02: 650 mg via ORAL
  Filled 2022-06-02: qty 2

## 2022-06-02 MED ORDER — SODIUM CHLORIDE 0.9 % IV BOLUS
1000.0000 mL | Freq: Once | INTRAVENOUS | Status: AC
  Administered 2022-06-02: 1000 mL via INTRAVENOUS

## 2022-06-02 MED ORDER — ONDANSETRON 4 MG PO TBDP
4.0000 mg | ORAL_TABLET | Freq: Once | ORAL | Status: AC
  Administered 2022-06-02: 4 mg via ORAL
  Filled 2022-06-02: qty 1

## 2022-06-02 MED ORDER — DOXYCYCLINE HYCLATE 100 MG PO CAPS: 100.0000 mg | ORAL_CAPSULE | Freq: Two times a day (BID) | ORAL | 0 refills | Status: AC

## 2022-06-02 MED ORDER — ONDANSETRON HCL 4 MG PO TABS: 4.0000 mg | ORAL_TABLET | Freq: Four times a day (QID) | ORAL | 0 refills | Status: DC

## 2022-06-02 NOTE — ED Triage Notes (Signed)
Pt arrives pov, steady gait c/o abd pain with n/v/d starting this morning. Reports nausea last night. Temp 102.7 in triage

## 2022-06-02 NOTE — ED Provider Notes (Signed)
[facility] Provider Note   CSN: [id number] Arrival date & time: 06/02/22  1023     History  Chief Complaint  Patient presents with   Abdominal Pain    Edward Bray is a 27 y.o. male.  Patient here with abdominal pain, nausea, vomiting, diarrhea for the last day.  Nothing makes it worse or better.  Family member with the same.  Denies any suspicious food intake.  Has had fevers.  Denies any ongoing abdominal pain currently.  No chest pain or cough or sputum production.  No pain with urination.  The history is provided by the patient.       Home Medications Prior to Admission medications   Medication Sig Start Date End Date Taking? Authorizing Provider  ondansetron (ZOFRAN) 4 MG tablet Take 1 tablet (4 mg total) by mouth every 6 (six) hours. 06/02/22  Yes [employer], DO  methocarbamol (ROBAXIN) 500 MG tablet Take 1 tablet (500 mg total) by mouth 2 (two) times daily. 09/17/21   Ripley Fraise, MD  promethazine (PHENERGAN) 25 MG tablet Take 1 tablet (25 mg total) by mouth every 6 (six) hours as needed for nausea or vomiting. 05/21/14 08/04/19  Quintella Reichert, MD      Allergies    Patient has no known allergies.    Review of Systems   Review of Systems  Physical Exam Updated Vital Signs BP 109/85   Pulse (!) 101   Temp 99.6 F (37.6 C) (Oral)   Resp 20   Wt 81.6 kg   SpO2 98%   BMI 26.58 kg/m  Physical Exam Vitals and nursing note reviewed.  Constitutional:      General: He is not in acute distress.    Appearance: He is well-developed.  HENT:     Head: Normocephalic and atraumatic.  Eyes:     Conjunctiva/sclera: Conjunctivae normal.  Cardiovascular:     Rate and Rhythm: Normal rate and regular rhythm.     Heart sounds: No murmur heard. Pulmonary:     Effort: Pulmonary effort is normal. No respiratory distress.     Breath sounds: Normal breath sounds.  Abdominal:     Palpations: Abdomen is soft.     Tenderness: There is no  abdominal tenderness.  Musculoskeletal:        General: No swelling.     Cervical back: Neck supple.  Skin:    General: Skin is warm and dry.     Capillary Refill: Capillary refill takes less than 2 seconds.  Neurological:     Mental Status: He is alert.  Psychiatric:        Mood and Affect: Mood normal.     ED Results / Procedures / Treatments   Labs (all labs ordered are listed, but only abnormal results are displayed) Labs Reviewed  RESP PANEL BY RT-PCR (RSV, FLU A&B, COVID)  RVPGX2 - Abnormal; Notable for the following components:      Result Value   Influenza A by PCR POSITIVE (*)    All other components within normal limits  COMPREHENSIVE METABOLIC PANEL - Abnormal; Notable for the following components:   Sodium 133 (*)    Potassium 3.2 (*)    CO2 20 (*)    Glucose, Bld 115 (*)    Total Protein 8.2 (*)    All other components within normal limits  CBC WITH DIFFERENTIAL/PLATELET - Abnormal; Notable for the following components:   Lymphs Abs 0.4 (*)    All other components within  normal limits  URINALYSIS, ROUTINE W REFLEX MICROSCOPIC - Abnormal; Notable for the following components:   Hgb urine dipstick SMALL (*)    Protein, ur 30 (*)    All other components within normal limits  URINALYSIS, MICROSCOPIC (REFLEX) - Abnormal; Notable for the following components:   Bacteria, UA FEW (*)    All other components within normal limits  LIPASE, BLOOD    EKG None  Radiology DG Chest 2 View  Result Date: 06/02/2022 CLINICAL DATA:  Cough, fever, abdominal pain. EXAM: CHEST - 2 VIEW COMPARISON:  Chest radiograph 01/13/2021 FINDINGS: Patchy right basilar opacity suspicious for pneumonia. Left lung is grossly clear. Normal heart size with stable mediastinal contours. No pulmonary edema, pleural effusion or pneumothorax. Moderate thoracic scoliotic curvature again seen. IMPRESSION: Patchy right basilar opacity suspicious for pneumonia. Electronically Signed   By: Keith Rake M.D.   On: 06/02/2022 11:14    Procedures Procedures    Medications Ordered in ED Medications  acetaminophen (TYLENOL) tablet 650 mg (650 mg Oral Given 06/02/22 1051)  ondansetron (ZOFRAN-ODT) disintegrating tablet 4 mg (4 mg Oral Given 06/02/22 1059)  sodium chloride 0.9 % bolus 1,000 mL (1,000 mLs Intravenous New Bag/Given 06/02/22 1145)    ED Course/ Medical Decision Making/ A&P                             Medical Decision Making Amount and/or Complexity of Data Reviewed Labs: ordered. Radiology: ordered.  Risk OTC drugs. Prescription drug management.   Demetria Gair is here with nausea vomiting diarrhea.  He is very well-appearing.  Does have a fever.  Differential diagnosis likely viral process or foodborne illness.  He has no abdominal tenderness on exam.  Have no concern for intra-abdominal infectious process including appendicitis.  Blood tests were obtained including CBC, CMP and lipase and COVID and flu test.  Chest x-ray also obtained.  Chest x-ray may be with basilar opacity.  However he does not have cough or sputum production.  I do not have any concern for pneumonia.  He is positive for influenza which I think is the cause of his symptoms.  He has no leukocytosis.  Overall will treat with Zofran and conservatively with antibiotics.  Discharged in good condition.  This chart was dictated using voice recognition software.  Despite best efforts to proofread,  errors can occur which can change the documentation meaning.         Final Clinical Impression(s) / ED Diagnoses Final diagnoses:  Influenza A    Rx / DC Orders ED Discharge Orders          Ordered    ondansetron (ZOFRAN) 4 MG tablet  Every 6 hours        06/02/22 1212              Lennice Sites, DO 06/02/22 1214

## 2022-06-04 ENCOUNTER — Emergency Department (HOSPITAL_BASED_OUTPATIENT_CLINIC_OR_DEPARTMENT_OTHER)
Admission: EM | Admit: 2022-06-04 | Discharge: 2022-06-04 | Disposition: A | Discharge status: Home / Self Care | Payer: Self-pay | Attending: Emergency Medicine | Admitting: Emergency Medicine

## 2022-06-04 ENCOUNTER — Other Ambulatory Visit: Payer: Self-pay

## 2022-06-04 ENCOUNTER — Encounter (HOSPITAL_BASED_OUTPATIENT_CLINIC_OR_DEPARTMENT_OTHER): Payer: Self-pay | Admitting: Emergency Medicine

## 2022-06-04 DIAGNOSIS — J111 Influenza due to unidentified influenza virus with other respiratory manifestations: Secondary | ICD-10-CM | POA: Insufficient documentation

## 2022-06-04 MED ORDER — ACETAMINOPHEN 325 MG PO TABS
650.0000 mg | ORAL_TABLET | Freq: Once | ORAL | Status: AC | PRN
  Administered 2022-06-04: 650 mg via ORAL
  Filled 2022-06-04: qty 2

## 2022-06-04 NOTE — Discharge Instructions (Signed)
Evaluation for your ongoing fever is likely that you still have the flu that you are symptomatic.  Recommend that continue conservative treatment at home.  I have provided you a work note to return to work next Monday.

## 2022-06-04 NOTE — ED Triage Notes (Signed)
Pt seen  here on Sunday dx with Flu , still feeling bad  and needs another work note still has a temp

## 2022-06-04 NOTE — ED Provider Notes (Signed)
  [facility]PART[facility] Provider Note   CSN: [id number] Arrival date & time: 06/04/22  1754     History  Chief Complaint  Patient presents with   Letter for School/Work   Fever   HPI Edward Bray is a 27 y.o. male presenting for fever and work note.  Was diagnosed with the flu January 14.  Patient states he continues to have a fever, body aches and cough and congestion at home.  Denies shortness of breath or chest pain.  Extension of his work note as he is still symptomatic from the flu.   Fever      Home Medications Prior to Admission medications   Medication Sig Start Date End Date Taking? Authorizing Provider  doxycycline (VIBRAMYCIN) 100 MG capsule Take 1 capsule (100 mg total) by mouth 2 (two) times daily for 7 days. 06/02/22 06/09/22  Curatolo, Adam, DO  methocarbamol (ROBAXIN) 500 MG tablet Take 1 tablet (500 mg total) by mouth 2 (two) times daily. 09/17/21   Ripley Fraise, MD  ondansetron (ZOFRAN) 4 MG tablet Take 1 tablet (4 mg total) by mouth every 6 (six) hours. 06/02/22   Curatolo, Adam, DO  promethazine (PHENERGAN) 25 MG tablet Take 1 tablet (25 mg total) by mouth every 6 (six) hours as needed for nausea or vomiting. 05/21/14 08/04/19  Quintella Reichert, MD      Allergies    Patient has no known allergies.    Review of Systems   Review of Systems  Constitutional:  Positive for fever.    Physical Exam   Vitals:   06/04/22 1802  BP: 112/82  Pulse: 89  Resp: 18  Temp: 100.1 F (37.8 C)  SpO2: 100%    CONSTITUTIONAL:  ill-appearing, NAD NEURO:  Alert and oriented x 3, CN 3-12 grossly intact EYES:  eyes equal and reactive ENT/NECK:  Supple, no stridor  CARDIO:  regular rate and rhythm, appears well-perfused  PULM:  No respiratory distress, CTAB MSK/SPINE:  No gross deformities, no edema, moves all extremities  SKIN:  no rash, atraumatic   *Additional and/or pertinent findings included in MDM below    ED Results / Procedures /  Treatments   Labs (all labs ordered are listed, but only abnormal results are displayed) Labs Reviewed - No data to display  EKG None  Radiology No results found.  Procedures Procedures    Medications Ordered in ED Medications  acetaminophen (TYLENOL) tablet 650 mg (650 mg Oral Given 06/04/22 1808)    ED Course/ Medical Decision Making/ A&P                             Medical Decision Making Risk OTC drugs.   27 year old male who is ill-appearing otherwise he medically stable presenting for ongoing fever in setting of recent flu diagnosis and is requesting extension of work note.  Patient endorses fever at home.  Treated his low-grade fever with Tylenol.  Provided extension of his work note.  Discussed return precautions.  Advised to continue conservative treatment at home for the flu.         Final Clinical Impression(s) / ED Diagnoses Final diagnoses:  Influenza    Rx / DC Orders ED Discharge Orders     None         Harriet Pho, PA-C 06/04/22 Gertha Calkin, MD 06/04/22 2242

## 2022-09-11 ENCOUNTER — Encounter (HOSPITAL_BASED_OUTPATIENT_CLINIC_OR_DEPARTMENT_OTHER): Payer: Self-pay | Admitting: Emergency Medicine

## 2022-09-11 ENCOUNTER — Emergency Department (HOSPITAL_BASED_OUTPATIENT_CLINIC_OR_DEPARTMENT_OTHER)
Admission: EM | Admit: 2022-09-11 | Discharge: 2022-09-11 | Disposition: A | Discharge status: Home / Self Care | Payer: Managed Care, Other (non HMO) | Attending: Emergency Medicine | Admitting: Emergency Medicine

## 2022-09-11 DIAGNOSIS — R197 Diarrhea, unspecified: Secondary | ICD-10-CM | POA: Diagnosis not present

## 2022-09-11 DIAGNOSIS — D72819 Decreased white blood cell count, unspecified: Secondary | ICD-10-CM | POA: Insufficient documentation

## 2022-09-11 DIAGNOSIS — R112 Nausea with vomiting, unspecified: Secondary | ICD-10-CM | POA: Diagnosis present

## 2022-09-11 LAB — COMPREHENSIVE METABOLIC PANEL
ALT: 18 U/L (ref 0–44)
AST: 22 U/L (ref 15–41)
Albumin: 4.3 g/dL (ref 3.5–5.0)
Alkaline Phosphatase: 51 U/L (ref 38–126)
Anion gap: 6 (ref 5–15)
BUN: 11 mg/dL (ref 6–20)
CO2: 25 mmol/L (ref 22–32)
Calcium: 8.8 mg/dL — ABNORMAL LOW (ref 8.9–10.3)
Chloride: 105 mmol/L (ref 98–111)
Creatinine, Ser: 0.9 mg/dL (ref 0.61–1.24)
GFR, Estimated: >60 mL/min (ref >60)
Glucose, Bld: 88 mg/dL (ref 70–99)
Potassium: 4 mmol/L (ref 3.5–5.1)
Sodium: 136 mmol/L (ref 135–145)
Total Bilirubin: 0.8 mg/dL (ref 0.3–1.2)
Total Protein: 7.9 g/dL (ref 6.5–8.1)

## 2022-09-11 LAB — URINALYSIS, ROUTINE W REFLEX MICROSCOPIC
Bilirubin Urine: NEGATIVE
Glucose, UA: NEGATIVE mg/dL
Ketones, ur: NEGATIVE mg/dL
Leukocytes,Ua: NEGATIVE
Nitrite: NEGATIVE
Protein, ur: NEGATIVE mg/dL
Specific Gravity, Urine: 1.025 (ref 1.005–1.030)
pH: 5.5 (ref 5.0–8.0)

## 2022-09-11 LAB — CBC
HCT: 44.9 % (ref 39.0–52.0)
Hemoglobin: 15.1 g/dL (ref 13.0–17.0)
MCH: 30.8 pg (ref 26.0–34.0)
MCHC: 33.6 g/dL (ref 30.0–36.0)
MCV: 91.6 fL (ref 80.0–100.0)
Platelets: 273 10*3/uL (ref 150–400)
RBC: 4.9 MIL/uL (ref 4.22–5.81)
RDW: 12.1 % (ref 11.5–15.5)
WBC: 3.4 10*3/uL — ABNORMAL LOW (ref 4.0–10.5)
nRBC: 0 % (ref 0.0–0.2)

## 2022-09-11 LAB — URINALYSIS, MICROSCOPIC (REFLEX)

## 2022-09-11 LAB — LIPASE, BLOOD: Lipase: 34 U/L (ref 11–51)

## 2022-09-11 MED ORDER — ONDANSETRON HCL 4 MG PO TABS: 4.0000 mg | ORAL_TABLET | Freq: Four times a day (QID) | ORAL | 0 refills | Status: DC

## 2022-09-11 MED ORDER — SODIUM CHLORIDE 0.9 % IV BOLUS
1000.0000 mL | Freq: Once | INTRAVENOUS | Status: AC
  Administered 2022-09-11: 1000 mL via INTRAVENOUS

## 2022-09-11 MED ORDER — ONDANSETRON HCL 4 MG/2ML IJ SOLN
4.0000 mg | Freq: Once | INTRAMUSCULAR | Status: AC
  Administered 2022-09-11: 4 mg via INTRAVENOUS
  Filled 2022-09-11: qty 2

## 2022-09-11 NOTE — ED Triage Notes (Signed)
Pt c/o NVD after eating last night, continued into this morning; c/o upper abd pain

## 2022-09-11 NOTE — ED Provider Notes (Signed)
[facility] AT [facility] Provider Note   CSN: [id number] Arrival date & time: 09/11/22  1108     History  Chief Complaint  Patient presents with   Emesis    Edward Bray is a 27 y.o. male.  Denies any past medical history.  Presents to the ER he started having some nausea while at work yesterday that was mild.  He then went to [employer] after work and had some chicken and shrimp and when he got home was more nauseous and had multiple episodes of vomiting and several episodes of watery stool as well.  States he is vomited about 10 times this morning, tried drinking ginger ale but unable to keep it down.  Denies any abdominal pain, no fevers or chills, no sick contacts.  Denies hematemesis or    Emesis      Home Medications Prior to Admission medications   Medication Sig Start Date End Date Taking? Authorizing Provider  methocarbamol (ROBAXIN) 500 MG tablet Take 1 tablet (500 mg total) by mouth 2 (two) times daily. 09/17/21   Zadie Rhine, MD  ondansetron (ZOFRAN) 4 MG tablet Take 1 tablet (4 mg total) by mouth every 6 (six) hours. 09/11/22   Carmel Sacramento A, PA-C  promethazine (PHENERGAN) 25 MG tablet Take 1 tablet (25 mg total) by mouth every 6 (six) hours as needed for nausea or vomiting. 05/21/14 08/04/19  Tilden Fossa, MD      Allergies    Patient has no known allergies.    Review of Systems   Review of Systems  Gastrointestinal:  Positive for vomiting.    Physical Exam Updated Vital Signs BP 124/82 (BP Location: Left Arm)   Pulse (!) 59   Temp 98.5 F (36.9 C) (Oral)   Resp 18   Ht  (1.753 m)   Wt 86.2 kg   SpO2 100%   BMI 28.06 kg/m  Physical Exam Vitals and nursing note reviewed.  Constitutional:      General: He is not in acute distress.    Appearance: He is well-developed.  HENT:     Head: Normocephalic and atraumatic.  Eyes:     Conjunctiva/sclera: Conjunctivae normal.  Cardiovascular:     Rate and  Rhythm: Normal rate and regular rhythm.     Heart sounds: No murmur heard. Pulmonary:     Effort: Pulmonary effort is normal. No respiratory distress.     Breath sounds: Normal breath sounds.  Abdominal:     General: There is no distension.     Palpations: Abdomen is soft.     Tenderness: There is no abdominal tenderness.  Musculoskeletal:        General: No swelling.     Cervical back: Neck supple.  Skin:    General: Skin is warm and dry.     Capillary Refill: Capillary refill takes less than 2 seconds.  Neurological:     General: No focal deficit present.     Mental Status: He is alert and oriented to person, place, and time.  Psychiatric:        Mood and Affect: Mood normal.     ED Results / Procedures / Treatments   Labs (all labs ordered are listed, but only abnormal results are displayed) Labs Reviewed  COMPREHENSIVE METABOLIC PANEL - Abnormal; Notable for the following components:      Result Value   Calcium 8.8 (*)    All other components within normal limits  CBC - Abnormal; Notable  for the following components:   WBC 3.4 (*)    All other components within normal limits  URINALYSIS, ROUTINE W REFLEX MICROSCOPIC - Abnormal; Notable for the following components:   Hgb urine dipstick TRACE (*)    All other components within normal limits  URINALYSIS, MICROSCOPIC (REFLEX) - Abnormal; Notable for the following components:   Bacteria, UA RARE (*)    All other components within normal limits  LIPASE, BLOOD    EKG None  Radiology No results found.  Procedures Procedures    Medications Ordered in ED Medications  ondansetron (ZOFRAN) injection 4 mg (4 mg Intravenous Given 09/11/22 1239)  sodium chloride 0.9 % bolus 1,000 mL (1,000 mLs Intravenous New Bag/Given 09/11/22 1238)    ED Course/ Medical Decision Making/ A&P Clinical Course as of 09/11/22 1319  Wed Sep 11, 2022  1319 Patient tolerating p.o. ginger ale, feeling much better, requesting work note [CB]     Clinical Course User Index [CB] Ma Rings, PA-C                             Medical Decision Making This patient presents to the ED for concern of n/v/d, this involves an extensive number of treatment options, and is a complaint that carries with it a high risk of complications and morbidity.  The differential diagnosis includes gastritis, gastroenteritis, appendicitis, cholecystitis, diverticulitis, DKA, nephrolithiasis, gastroparesis, other    Co morbidities that complicate the patient evaluation  none   Additional history obtained:  Additional history obtained from EMR External records from outside source obtained and reviewed including prior ED visits   Lab Tests:  I Ordered, and personally interpreted labs.  The pertinent results include:  mild leukopenia, otherwise normal CBC, CMP, Lipase, UA     Problem List / ED Course / Critical interventions / Medication management  N/V/D-reassuring labs and exam, history and exam consistent with likely gastroenteritis, patient is well-appearing, well-hydrated, vitals are reassuring.  Will provide symptomatic management, discharged home with Zofran, instructions on 20 L at home, PCP follow-up with strict return precautions.  Discussed with patient this is likely a self-limited illness, should not last more than 48 hours and the diarrhea can linger, but Follow-up with PCP. I ordered medication including Zofran for nausea Reevaluation of the patient after these medicines showed that the patient improved I have reviewed the patients home medicines and have made adjustments as needed    Amount and/or Complexity of Data Reviewed Labs: ordered.           Final Clinical Impression(s) / ED Diagnoses Final diagnoses:  Nausea vomiting and diarrhea    Rx / DC Orders ED Discharge Orders          Ordered    ondansetron (ZOFRAN) 4 MG tablet  Every 6 hours        09/11/22 1242              Josem Kaufmann 09/11/22 1319    Rondel Baton, MD 09/11/22 1702

## 2022-09-11 NOTE — ED Notes (Signed)
ED Provider at bedside. 

## 2022-09-11 NOTE — Discharge Instructions (Signed)
Your blood work was reassuring.  Your symptoms will likely be limited to the next couple of days though the diarrhea can linger somewhat longer.  Avoid spicy or greasy foods, he can take over-the-counter probiotics or eat yogurt with probiotics help with the diarrhea.  Contact the ER if you have severe pain, cannot keep down fluids or have any other worrisome changes.  Otherwise follow-up with primary care.

## 2023-01-01 ENCOUNTER — Emergency Department
Admission: EM | Admit: 2023-01-01 | Discharge: 2023-01-01 | Disposition: A | Discharge status: Home / Self Care | Payer: No Typology Code available for payment source | Attending: Emergency Medicine | Admitting: Emergency Medicine

## 2023-01-01 ENCOUNTER — Emergency Department: Payer: No Typology Code available for payment source

## 2023-01-01 ENCOUNTER — Other Ambulatory Visit: Payer: Self-pay

## 2023-01-01 ENCOUNTER — Encounter: Payer: Self-pay | Admitting: Emergency Medicine

## 2023-01-01 DIAGNOSIS — R519 Headache, unspecified: Secondary | ICD-10-CM | POA: Diagnosis present

## 2023-01-01 DIAGNOSIS — S0990XA Unspecified injury of head, initial encounter: Secondary | ICD-10-CM | POA: Diagnosis not present

## 2023-01-01 DIAGNOSIS — Y92410 Unspecified street and highway as the place of occurrence of the external cause: Secondary | ICD-10-CM | POA: Insufficient documentation

## 2023-01-01 DIAGNOSIS — S39012A Strain of muscle, fascia and tendon of lower back, initial encounter: Secondary | ICD-10-CM | POA: Insufficient documentation

## 2023-01-01 DIAGNOSIS — M25571 Pain in right ankle and joints of right foot: Secondary | ICD-10-CM | POA: Insufficient documentation

## 2023-01-01 DIAGNOSIS — M25511 Pain in right shoulder: Secondary | ICD-10-CM | POA: Insufficient documentation

## 2023-01-01 DIAGNOSIS — S161XXA Strain of muscle, fascia and tendon at neck level, initial encounter: Secondary | ICD-10-CM | POA: Insufficient documentation

## 2023-01-01 MED ORDER — TRAMADOL HCL 50 MG PO TABS: 50.0000 mg | ORAL_TABLET | Freq: Four times a day (QID) | ORAL | 0 refills | Status: AC | PRN

## 2023-01-01 MED ORDER — MELOXICAM 15 MG PO TABS: 15.0000 mg | ORAL_TABLET | Freq: Every day | ORAL | 2 refills | Status: AC

## 2023-01-01 MED ORDER — BACLOFEN 10 MG PO TABS: 10.0000 mg | ORAL_TABLET | Freq: Three times a day (TID) | ORAL | 0 refills | Status: AC

## 2023-01-01 MED ORDER — OXYCODONE-ACETAMINOPHEN 5-325 MG PO TABS
1.0000 | ORAL_TABLET | Freq: Once | ORAL | Status: AC
  Administered 2023-01-01: 1 via ORAL
  Filled 2023-01-01: qty 1

## 2023-01-01 MED ORDER — KETOROLAC TROMETHAMINE 15 MG/ML IJ SOLN
15.0000 mg | Freq: Once | INTRAMUSCULAR | Status: AC
  Administered 2023-01-01: 15 mg via INTRAMUSCULAR
  Filled 2023-01-01: qty 1
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
## 2023-01-01 NOTE — Discharge Instructions (Signed)
Follow-up with orthopedics if not improving in 1 week.  Return emergency department worsening.  Apply ice to all areas that hurt.  After 3 days she can use wet heat followed by ice.  Do not use a dry heating pad as this will make your muscles more brittle. Take the medications as prescribed.  Tramadol is for pain not controlled by the baclofen and the meloxicam.
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
## 2023-01-01 NOTE — ED Notes (Signed)
Pt given a urinal.

## 2023-01-01 NOTE — ED Notes (Signed)
Patient transported to CT/XR ?

## 2023-01-01 NOTE — ED Notes (Signed)
Patient's significant other at bedside.

## 2023-01-14 ENCOUNTER — Emergency Department (HOSPITAL_COMMUNITY)
Admission: EM | Admit: 2023-01-14 | Discharge: 2023-01-14 | Discharge status: Against Medical Advice | Payer: Self-pay | Attending: Emergency Medicine | Admitting: Emergency Medicine

## 2023-01-14 ENCOUNTER — Emergency Department (HOSPITAL_BASED_OUTPATIENT_CLINIC_OR_DEPARTMENT_OTHER)
Admission: EM | Admit: 2023-01-14 | Discharge: 2023-01-14 | Disposition: A | Discharge status: Home / Self Care | Payer: Self-pay | Source: Home / Self Care | Attending: Emergency Medicine | Admitting: Emergency Medicine

## 2023-01-14 ENCOUNTER — Emergency Department (HOSPITAL_COMMUNITY): Payer: Self-pay

## 2023-01-14 ENCOUNTER — Other Ambulatory Visit: Payer: Self-pay

## 2023-01-14 ENCOUNTER — Encounter (HOSPITAL_BASED_OUTPATIENT_CLINIC_OR_DEPARTMENT_OTHER): Payer: Self-pay | Admitting: Emergency Medicine

## 2023-01-14 ENCOUNTER — Emergency Department (HOSPITAL_BASED_OUTPATIENT_CLINIC_OR_DEPARTMENT_OTHER): Payer: Self-pay

## 2023-01-14 ENCOUNTER — Encounter (HOSPITAL_COMMUNITY): Payer: Self-pay

## 2023-01-14 DIAGNOSIS — M542 Cervicalgia: Secondary | ICD-10-CM | POA: Insufficient documentation

## 2023-01-14 DIAGNOSIS — M545 Low back pain, unspecified: Secondary | ICD-10-CM | POA: Diagnosis not present

## 2023-01-14 DIAGNOSIS — M546 Pain in thoracic spine: Secondary | ICD-10-CM | POA: Insufficient documentation

## 2023-01-14 DIAGNOSIS — Y92410 Unspecified street and highway as the place of occurrence of the external cause: Secondary | ICD-10-CM | POA: Insufficient documentation

## 2023-01-14 DIAGNOSIS — Z5321 Procedure and treatment not carried out due to patient leaving prior to being seen by health care provider: Secondary | ICD-10-CM | POA: Insufficient documentation

## 2023-01-14 MED ORDER — IBUPROFEN 600 MG PO TABS: 600.0000 mg | ORAL_TABLET | Freq: Four times a day (QID) | ORAL | 0 refills | Status: DC | PRN

## 2023-01-14 MED ORDER — CYCLOBENZAPRINE HCL 10 MG PO TABS
10.0000 mg | ORAL_TABLET | Freq: Once | ORAL | Status: AC
  Administered 2023-01-14: 10 mg via ORAL
  Filled 2023-01-14: qty 1

## 2023-01-14 MED ORDER — CYCLOBENZAPRINE HCL 10 MG PO TABS: 10.0000 mg | ORAL_TABLET | Freq: Two times a day (BID) | ORAL | 0 refills | Status: AC | PRN

## 2023-01-14 MED ORDER — KETOROLAC TROMETHAMINE 30 MG/ML IJ SOLN
30.0000 mg | Freq: Once | INTRAMUSCULAR | Status: AC
  Administered 2023-01-14: 30 mg via INTRAMUSCULAR
  Filled 2023-01-14: qty 1
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
## 2023-01-14 NOTE — Discharge Instructions (Addendum)
As discussed, workup today overall reassuring.  CT imaging of your neck was negative for any acute fracture or dislocation.  Will recommend treatment of your pain with anti-inflammatory medication as well as muscle laxer use as needed.  Note the muscle laxer can cause drowsiness so please do not drive while taking medication or perform any high risk activity anteriorly as its effects on you.  Expect symptoms to worsen over the next 1 to 2 days before they begin to get better.  Recommend follow-up with primary care for reassessment of your symptoms.  Please do not hesitate to return to emergency department if there are worrisome signs and symptoms we discussed become apparent.
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
## 2023-01-14 NOTE — ED Notes (Signed)
Patient transported to CT 

## 2023-02-05 ENCOUNTER — Emergency Department (HOSPITAL_BASED_OUTPATIENT_CLINIC_OR_DEPARTMENT_OTHER): Payer: BLUE CROSS/BLUE SHIELD

## 2023-02-05 ENCOUNTER — Emergency Department (HOSPITAL_BASED_OUTPATIENT_CLINIC_OR_DEPARTMENT_OTHER)
Admission: EM | Admit: 2023-02-05 | Discharge: 2023-02-05 | Disposition: A | Discharge status: Home / Self Care | Payer: No Typology Code available for payment source | Attending: Emergency Medicine | Admitting: Emergency Medicine

## 2023-02-05 ENCOUNTER — Encounter (HOSPITAL_BASED_OUTPATIENT_CLINIC_OR_DEPARTMENT_OTHER): Payer: Self-pay | Admitting: Emergency Medicine

## 2023-02-05 ENCOUNTER — Other Ambulatory Visit: Payer: Self-pay

## 2023-02-05 DIAGNOSIS — Y92410 Unspecified street and highway as the place of occurrence of the external cause: Secondary | ICD-10-CM | POA: Diagnosis not present

## 2023-02-05 DIAGNOSIS — M545 Low back pain, unspecified: Secondary | ICD-10-CM | POA: Insufficient documentation

## 2023-02-05 DIAGNOSIS — M542 Cervicalgia: Secondary | ICD-10-CM | POA: Insufficient documentation

## 2023-02-05 MED ORDER — IBUPROFEN 400 MG PO TABS
600.0000 mg | ORAL_TABLET | Freq: Once | ORAL | Status: AC
  Administered 2023-02-05: 600 mg via ORAL
  Filled 2023-02-05: qty 1

## 2023-02-05 MED ORDER — METHOCARBAMOL 500 MG PO TABS: 500.0000 mg | ORAL_TABLET | Freq: Two times a day (BID) | ORAL | 0 refills | Status: AC | PRN

## 2023-02-05 MED ORDER — IBUPROFEN 800 MG PO TABS: 800.0000 mg | ORAL_TABLET | Freq: Three times a day (TID) | ORAL | 0 refills | Status: AC | PRN

## 2023-02-05 MED ORDER — ACETAMINOPHEN 500 MG PO TABS
1000.0000 mg | ORAL_TABLET | Freq: Once | ORAL | Status: AC
  Administered 2023-02-05: 1000 mg via ORAL
  Filled 2023-02-05: qty 2
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
## 2023-02-05 NOTE — ED Notes (Signed)
Patient transported to CT 
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
## 2023-02-17 ENCOUNTER — Other Ambulatory Visit: Payer: Self-pay

## 2023-02-17 ENCOUNTER — Encounter (HOSPITAL_BASED_OUTPATIENT_CLINIC_OR_DEPARTMENT_OTHER): Payer: Self-pay

## 2023-02-17 ENCOUNTER — Emergency Department (HOSPITAL_BASED_OUTPATIENT_CLINIC_OR_DEPARTMENT_OTHER)
Admission: EM | Admit: 2023-02-17 | Discharge: 2023-02-18 | Disposition: A | Discharge status: Home / Self Care | Payer: BLUE CROSS/BLUE SHIELD | Attending: Emergency Medicine | Admitting: Emergency Medicine

## 2023-02-17 DIAGNOSIS — R509 Fever, unspecified: Secondary | ICD-10-CM | POA: Insufficient documentation

## 2023-02-17 DIAGNOSIS — R197 Diarrhea, unspecified: Secondary | ICD-10-CM | POA: Diagnosis not present

## 2023-02-17 DIAGNOSIS — R112 Nausea with vomiting, unspecified: Secondary | ICD-10-CM | POA: Diagnosis present

## 2023-02-17 DIAGNOSIS — E876 Hypokalemia: Secondary | ICD-10-CM | POA: Insufficient documentation

## 2023-02-17 DIAGNOSIS — Z1152 Encounter for screening for COVID-19: Secondary | ICD-10-CM | POA: Diagnosis not present

## 2023-02-17 DIAGNOSIS — R1084 Generalized abdominal pain: Secondary | ICD-10-CM | POA: Diagnosis not present

## 2023-02-17 LAB — URINALYSIS, ROUTINE W REFLEX MICROSCOPIC
Bilirubin Urine: NEGATIVE
Glucose, UA: NEGATIVE mg/dL
Hgb urine dipstick: NEGATIVE
Ketones, ur: NEGATIVE mg/dL
Leukocytes,Ua: NEGATIVE
Nitrite: NEGATIVE
Protein, ur: 30 mg/dL — AB
Specific Gravity, Urine: 1.02 (ref 1.005–1.030)
pH: 7.5 (ref 5.0–8.0)

## 2023-02-17 LAB — COMPREHENSIVE METABOLIC PANEL
ALT: 25 U/L (ref 0–44)
AST: 24 U/L (ref 15–41)
Albumin: 4.3 g/dL (ref 3.5–5.0)
Alkaline Phosphatase: 64 U/L (ref 38–126)
Anion gap: 12 (ref 5–15)
BUN: 13 mg/dL (ref 6–20)
CO2: 23 mmol/L (ref 22–32)
Calcium: 9.3 mg/dL (ref 8.9–10.3)
Chloride: 100 mmol/L (ref 98–111)
Creatinine, Ser: 1.27 mg/dL — ABNORMAL HIGH (ref 0.61–1.24)
GFR, Estimated: >60 mL/min (ref >60)
Glucose, Bld: 100 mg/dL — ABNORMAL HIGH (ref 70–99)
Potassium: 3.2 mmol/L — ABNORMAL LOW (ref 3.5–5.1)
Sodium: 135 mmol/L (ref 135–145)
Total Bilirubin: 0.6 mg/dL (ref 0.3–1.2)
Total Protein: 8.5 g/dL — ABNORMAL HIGH (ref 6.5–8.1)

## 2023-02-17 LAB — CBC WITH DIFFERENTIAL/PLATELET
Abs Immature Granulocytes: 0.01 10*3/uL (ref 0.00–0.07)
Basophils Absolute: 0 10*3/uL (ref 0.0–0.1)
Basophils Relative: 0 %
Eosinophils Absolute: 0 10*3/uL (ref 0.0–0.5)
Eosinophils Relative: 0 %
HCT: 43.2 % (ref 39.0–52.0)
Hemoglobin: 14.7 g/dL (ref 13.0–17.0)
Immature Granulocytes: 0 %
Lymphocytes Relative: 16 %
Lymphs Abs: 1 10*3/uL (ref 0.7–4.0)
MCH: 31.2 pg (ref 26.0–34.0)
MCHC: 34 g/dL (ref 30.0–36.0)
MCV: 91.7 fL (ref 80.0–100.0)
Monocytes Absolute: 0.4 10*3/uL (ref 0.1–1.0)
Monocytes Relative: 7 %
Neutro Abs: 4.6 10*3/uL (ref 1.7–7.7)
Neutrophils Relative %: 77 %
Platelets: 235 10*3/uL (ref 150–400)
RBC: 4.71 MIL/uL (ref 4.22–5.81)
RDW: 11.8 % (ref 11.5–15.5)
WBC: 6 10*3/uL (ref 4.0–10.5)
nRBC: 0 % (ref 0.0–0.2)

## 2023-02-17 LAB — RESP PANEL BY RT-PCR (RSV, FLU A&B, COVID)  RVPGX2
Influenza A by PCR: NEGATIVE
Influenza B by PCR: NEGATIVE
Resp Syncytial Virus by PCR: NEGATIVE
SARS Coronavirus 2 by RT PCR: NEGATIVE

## 2023-02-17 LAB — URINALYSIS, MICROSCOPIC (REFLEX): Bacteria, UA: NONE SEEN

## 2023-02-17 MED ORDER — ONDANSETRON HCL 4 MG/2ML IJ SOLN
4.0000 mg | Freq: Once | INTRAMUSCULAR | Status: AC
  Administered 2023-02-17: 4 mg via INTRAVENOUS
  Filled 2023-02-17: qty 2

## 2023-02-17 MED ORDER — IBUPROFEN 800 MG PO TABS
800.0000 mg | ORAL_TABLET | Freq: Once | ORAL | Status: AC
  Administered 2023-02-17: 800 mg via ORAL
  Filled 2023-02-17: qty 1

## 2023-02-17 NOTE — ED Triage Notes (Signed)
N/V, HA, loss of taste and smell, and cough x 1 day fevers

## 2023-02-18 MED ORDER — POTASSIUM CHLORIDE CRYS ER 20 MEQ PO TBCR
20.0000 meq | EXTENDED_RELEASE_TABLET | Freq: Once | ORAL | Status: AC
  Administered 2023-02-18: 20 meq via ORAL
  Filled 2023-02-18: qty 1

## 2023-02-18 MED ORDER — ONDANSETRON 4 MG PO TBDP: 4.0000 mg | ORAL_TABLET | Freq: Three times a day (TID) | ORAL | 0 refills | Status: DC | PRN

## 2023-02-18 NOTE — ED Notes (Signed)
PO challenge was completed with ginger ale and his potassium pill.  No Nausea/vomiting.  Patient tolerated.

## 2023-02-18 NOTE — ED Provider Notes (Signed)
[place] EMERGENCY DEPARTMENT AT [facility] [place] Provider Note   CSN: [id number] Arrival date & time: 02/17/23  2111     History  Chief Complaint  Patient presents with   possible covid    Edward Bray is a 27 y.o. male.  The history is provided by the patient.  Edward Bray is a 27 y.o. male who presents to the Emergency Department complaining of vomiting and diarrhea.  He presents to the emergency department for evaluation of symptoms that started at 5 AM today with vomiting, diarrhea, chills and pain all over.  Overall at time of ED assessment he has not had any emesis or diarrhea for 2 hours and he states that his pain is significantly improved.  He did have a mild cough earlier, this is now gone.  No known sick contacts.  No dysuria, rash.  No abdominal pain.  He has no known medical problems and takes no routine medications.  No tobacco, alcohol, drug use.      Home Medications Prior to Admission medications   Medication Sig Start Date End Date Taking? Authorizing Provider  ondansetron (ZOFRAN-ODT) 4 MG disintegrating tablet Take 1 tablet (4 mg total) by mouth every 8 (eight) hours as needed for nausea or vomiting. 02/18/23  Yes Tilden Fossa, MD  cyclobenzaprine (FLEXERIL) 10 MG tablet Take 1 tablet (10 mg total) by mouth 2 (two) times daily as needed for muscle spasms. 01/14/23   Peter Garter, PA  ibuprofen (ADVIL) 800 MG tablet Take 1 tablet (800 mg total) by mouth every 8 (eight) hours as needed. 02/05/23   Terrilee Files, MD  meloxicam (MOBIC) 15 MG tablet Take 1 tablet (15 mg total) by mouth daily. 01/01/23 01/01/24  Fisher, Roselyn Bering, PA-C  methocarbamol (ROBAXIN) 500 MG tablet Take 1 tablet (500 mg total) by mouth 2 (two) times daily as needed for muscle spasms. 02/05/23   Terrilee Files, MD  traMADol (ULTRAM) 50 MG tablet Take 1 tablet (50 mg total) by mouth every 6 (six) hours as needed. 01/01/23   Fisher, Roselyn Bering, PA-C  promethazine (PHENERGAN) 25 MG  tablet Take 1 tablet (25 mg total) by mouth every 6 (six) hours as needed for nausea or vomiting. 05/21/14 08/04/19  Tilden Fossa, MD      Allergies    Patient has no known allergies.    Review of Systems   Review of Systems  All other systems reviewed and are negative.   Physical Exam Updated Vital Signs BP 120/78 (BP Location: Right Arm)   Pulse 82   Temp 98 F (36.7 C) (Oral)   Resp 18   Ht 5\' 9"  (1.753 m)   Wt 85.7 kg   SpO2 100%   BMI 27.91 kg/m  Physical Exam Vitals and nursing note reviewed.  Constitutional:      Appearance: He is well-developed.  HENT:     Head: Normocephalic and atraumatic.  Cardiovascular:     Rate and Rhythm: Normal rate and regular rhythm.     Heart sounds: No murmur heard. Pulmonary:     Effort: Pulmonary effort is normal. No respiratory distress.     Breath sounds: Normal breath sounds.  Abdominal:     Palpations: Abdomen is soft.     Tenderness: There is no guarding or rebound.     Comments: Mild generalized abdominal tenderness  Musculoskeletal:        General: No swelling or tenderness.     Cervical back: Neck supple.  Skin:  General: Skin is warm and dry.     Capillary Refill: Capillary refill takes less than 2 seconds.  Neurological:     Mental Status: He is alert and oriented to person, place, and time.  Psychiatric:        Behavior: Behavior normal.     ED Results / Procedures / Treatments   Labs (all labs ordered are listed, but only abnormal results are displayed) Labs Reviewed  COMPREHENSIVE METABOLIC PANEL - Abnormal; Notable for the following components:      Result Value   Potassium 3.2 (*)    Glucose, Bld 100 (*)    Creatinine, Ser 1.27 (*)    Total Protein 8.5 (*)    All other components within normal limits  URINALYSIS, ROUTINE W REFLEX MICROSCOPIC - Abnormal; Notable for the following components:   Protein, ur 30 (*)    All other components within normal limits  RESP PANEL BY RT-PCR (RSV, FLU A&B,  COVID)  RVPGX2  CBC WITH DIFFERENTIAL/PLATELET  URINALYSIS, MICROSCOPIC (REFLEX)    EKG None  Radiology No results found.  Procedures Procedures    Medications Ordered in ED Medications  ibuprofen (ADVIL) tablet 800 mg (800 mg Oral Given 02/17/23 2128)  ondansetron (ZOFRAN) injection 4 mg (4 mg Intravenous Given 02/17/23 2129)  potassium chloride SA (KLOR-CON M) CR tablet 20 mEq (20 mEq Oral Given 02/18/23 0054)    ED Course/ Medical Decision Making/ A&P                                 Medical Decision Making Amount and/or Complexity of Data Reviewed Labs: ordered.  Risk Prescription drug management.   Patient here for evaluation of vomiting, diarrhea and chills.  He did have a fever at time of ED presentation, resolved after ibuprofen administration.  He does have mild hypokalemia, this was replaced orally.  CBC without leukocytosis.  UA not consistent with UTI.  Lungs are clear bilaterally, no clinical evidence of pneumonia.  He does have mild tenderness on examination without peritoneal findings.  He is feeling well on evaluation and in the room.  He is tolerating p.o.  He is negative for COVID and flu.  Current clinical picture is not consistent with pancreatitis, diverticulitis, appendicitis, cholecystitis.  Discussed with patient home care for vomiting and diarrhea.  Discussed oral fluid hydration, will prescribe as needed antiemetic.  Plan to discharge home with outpatient follow-up and return precautions.        Final Clinical Impression(s) / ED Diagnoses Final diagnoses:  Nausea vomiting and diarrhea  Fever, unspecified fever cause    Rx / DC Orders ED Discharge Orders          Ordered    ondansetron (ZOFRAN-ODT) 4 MG disintegrating tablet  Every 8 hours PRN        02/18/23 0046              Tilden Fossa, MD 02/18/23 [phone]

## 2023-10-22 ENCOUNTER — Other Ambulatory Visit: Payer: Self-pay | Admitting: Surgery

## 2023-10-22 DIAGNOSIS — M542 Cervicalgia: Secondary | ICD-10-CM

## 2024-04-29 ENCOUNTER — Emergency Department (HOSPITAL_BASED_OUTPATIENT_CLINIC_OR_DEPARTMENT_OTHER): Payer: Self-pay

## 2024-04-29 ENCOUNTER — Encounter (HOSPITAL_BASED_OUTPATIENT_CLINIC_OR_DEPARTMENT_OTHER): Payer: Self-pay | Admitting: Emergency Medicine

## 2024-04-29 ENCOUNTER — Other Ambulatory Visit (HOSPITAL_BASED_OUTPATIENT_CLINIC_OR_DEPARTMENT_OTHER): Payer: Self-pay

## 2024-04-29 ENCOUNTER — Other Ambulatory Visit: Payer: Self-pay

## 2024-04-29 ENCOUNTER — Emergency Department (HOSPITAL_BASED_OUTPATIENT_CLINIC_OR_DEPARTMENT_OTHER)
Admission: EM | Admit: 2024-04-29 | Discharge: 2024-04-29 | Disposition: A | Discharge status: Home / Self Care | Payer: Self-pay | Attending: Emergency Medicine | Admitting: Emergency Medicine

## 2024-04-29 DIAGNOSIS — R1012 Left upper quadrant pain: Secondary | ICD-10-CM | POA: Insufficient documentation

## 2024-04-29 DIAGNOSIS — R197 Diarrhea, unspecified: Secondary | ICD-10-CM | POA: Insufficient documentation

## 2024-04-29 DIAGNOSIS — R112 Nausea with vomiting, unspecified: Secondary | ICD-10-CM | POA: Insufficient documentation

## 2024-04-29 LAB — URINALYSIS, MICROSCOPIC (REFLEX)

## 2024-04-29 LAB — COMPREHENSIVE METABOLIC PANEL WITH GFR
ALT: 14 U/L (ref 0–44)
AST: 21 U/L (ref 15–41)
Albumin: 4.3 g/dL (ref 3.5–5.0)
Alkaline Phosphatase: 71 U/L (ref 38–126)
Anion gap: 9 (ref 5–15)
BUN: 12 mg/dL (ref 6–20)
CO2: 25 mmol/L (ref 22–32)
Calcium: 8.9 mg/dL (ref 8.9–10.3)
Chloride: 105 mmol/L (ref 98–111)
Creatinine, Ser: 0.9 mg/dL (ref 0.61–1.24)
GFR, Estimated: >60 mL/min (ref >60)
Glucose, Bld: 115 mg/dL — ABNORMAL HIGH (ref 70–99)
Potassium: 3.5 mmol/L (ref 3.5–5.1)
Sodium: 139 mmol/L (ref 135–145)
Total Bilirubin: 0.3 mg/dL (ref 0.0–1.2)
Total Protein: 6.9 g/dL (ref 6.5–8.1)

## 2024-04-29 LAB — CBC
HCT: 39.6 % (ref 39.0–52.0)
Hemoglobin: 13.4 g/dL (ref 13.0–17.0)
MCH: 31.5 pg (ref 26.0–34.0)
MCHC: 33.8 g/dL (ref 30.0–36.0)
MCV: 93.2 fL (ref 80.0–100.0)
Platelets: 224 K/uL (ref 150–400)
RBC: 4.25 MIL/uL (ref 4.22–5.81)
RDW: 11.9 % (ref 11.5–15.5)
WBC: 4.2 K/uL (ref 4.0–10.5)
nRBC: 0 % (ref 0.0–0.2)

## 2024-04-29 LAB — URINALYSIS, ROUTINE W REFLEX MICROSCOPIC
Bilirubin Urine: NEGATIVE
Glucose, UA: NEGATIVE mg/dL
Ketones, ur: NEGATIVE mg/dL
Leukocytes,Ua: NEGATIVE
Nitrite: NEGATIVE
Protein, ur: NEGATIVE mg/dL
Specific Gravity, Urine: 1.015 (ref 1.005–1.030)
pH: 7 (ref 5.0–8.0)

## 2024-04-29 LAB — LIPASE, BLOOD: Lipase: 49 U/L (ref 11–51)

## 2024-04-29 LAB — TROPONIN T, HIGH SENSITIVITY: Troponin T High Sensitivity: <15 ng/L (ref 0–19)

## 2024-04-29 MED ORDER — DICYCLOMINE HCL 20 MG PO TABS
20.0000 mg | ORAL_TABLET | Freq: Two times a day (BID) | ORAL | 0 refills | Status: AC
  Filled 2024-04-29: qty 20, 10d supply, fill #0

## 2024-04-29 MED ORDER — LACTATED RINGERS IV BOLUS
1000.0000 mL | Freq: Once | INTRAVENOUS | Status: AC
  Administered 2024-04-29: 1000 mL via INTRAVENOUS

## 2024-04-29 MED ORDER — IOHEXOL 300 MG/ML  SOLN
100.0000 mL | Freq: Once | INTRAMUSCULAR | Status: AC | PRN
  Administered 2024-04-29: 100 mL via INTRAVENOUS

## 2024-04-29 MED ORDER — ONDANSETRON 4 MG PO TBDP
4.0000 mg | ORAL_TABLET | Freq: Three times a day (TID) | ORAL | 0 refills | Status: AC | PRN
  Filled 2024-04-29: qty 20, 7d supply, fill #0

## 2024-04-29 MED ORDER — PANTOPRAZOLE SODIUM 20 MG PO TBEC
40.0000 mg | DELAYED_RELEASE_TABLET | Freq: Every day | ORAL | 0 refills | Status: AC
  Filled 2024-04-29: qty 28, 14d supply, fill #0

## 2024-04-29 MED ORDER — ONDANSETRON HCL 4 MG/2ML IJ SOLN
4.0000 mg | Freq: Once | INTRAMUSCULAR | Status: AC
  Administered 2024-04-29: 4 mg via INTRAVENOUS
  Filled 2024-04-29: qty 2

## 2024-04-29 MED ORDER — MORPHINE SULFATE (PF) 4 MG/ML IV SOLN
4.0000 mg | Freq: Once | INTRAVENOUS | Status: AC
  Administered 2024-04-29: 4 mg via INTRAVENOUS
  Filled 2024-04-29: qty 1

## 2024-04-29 NOTE — ED Provider Notes (Signed)
 [place] [facility] [place] Provider Note   CSN: [id number] Arrival date & time: 04/29/24  9187     Patient presents with: Abdominal Pain   Edward Bray is a 28 y.o. male.   HPI     28yo male presents with concern for abdominal pain.  Went to work at [employer] last night in normal state of health.  He got off of work around 4 AM and when he got home he suddenly developed severe epigastric and left upper quadrant abdominal pain, with associated nausea, vomiting and diarrhea.  He reports he vomited and had diarrhea about 4 times each.  He denies any black or bloody stools, hematemesis.  Denies urinary symptoms, penile discharge.  When the pain was very severe he did develop some brief chest pain and shortness of breath in association with his severe abdominal pain that has since resolved.  That lasted a few minutes and stopped.  Denies any recent surgeries, long trips car airplane, history of DVT or PE.  Denies fevers.  No known sick contacts.  Denies smoking, alcohol or drug use.  He reports he did have kidney stones many years ago.  The pain is severe at this time, 8 out of 10.  It is located in the left upper quadrant and radiates down towards his groin.  Denies other testicular pain or tenderness  Past Medical History:  Diagnosis Date   Scoliosis     History reviewed. No pertinent surgical history.  Prior to Admission medications  Medication Sig Start Date End Date Taking? Authorizing Provider  cyclobenzaprine  (FLEXERIL ) 10 MG tablet Take 1 tablet (10 mg total) by mouth 2 (two) times daily as needed for muscle spasms. 01/14/23   Silver Wonda LABOR, PA  ibuprofen  (ADVIL ) 800 MG tablet Take 1 tablet (800 mg total) by mouth every 8 (eight) hours as needed. 02/05/23   Towana Ozell BROCKS, MD  methocarbamol  (ROBAXIN ) 500 MG tablet Take 1 tablet (500 mg total) by mouth 2 (two) times daily as needed for muscle spasms. 02/05/23   Towana Ozell BROCKS, MD  ondansetron   (ZOFRAN -ODT) 4 MG disintegrating tablet Take 1 tablet (4 mg total) by mouth every 8 (eight) hours as needed for nausea or vomiting. 02/18/23   Griselda Norris, MD  traMADol  (ULTRAM ) 50 MG tablet Take 1 tablet (50 mg total) by mouth every 6 (six) hours as needed. 01/01/23   Fisher, Devere ORN, PA-C  promethazine  (PHENERGAN ) 25 MG tablet Take 1 tablet (25 mg total) by mouth every 6 (six) hours as needed for nausea or vomiting. 05/21/14 08/04/19  Griselda Norris, MD    Allergies: Patient has no known allergies.    Review of Systems  Updated Vital Signs BP 107/79   Pulse 65   Temp 98.5 F (36.9 C)   Resp 16   Wt 88.5 kg   SpO2 98%   BMI 28.80 kg/m   Physical Exam Vitals and nursing note reviewed.  Constitutional:      General: He is not in acute distress.    Appearance: He is well-developed. He is not diaphoretic.  HENT:     Head: Normocephalic and atraumatic.  Eyes:     Conjunctiva/sclera: Conjunctivae normal.  Cardiovascular:     Rate and Rhythm: Normal rate and regular rhythm.     Pulses: Normal pulses.     Heart sounds: Normal heart sounds. No murmur heard.    No friction rub. No gallop.  Pulmonary:     Effort: Pulmonary effort  is normal. No respiratory distress.     Breath sounds: Normal breath sounds. No wheezing or rales.  Chest:     Chest wall: No tenderness.  Abdominal:     General: There is no distension.     Palpations: Abdomen is soft.     Tenderness: There is abdominal tenderness. There is no right CVA tenderness, left CVA tenderness or guarding (LUQ).  Genitourinary:    Comments: No testicular tenderness or swelling, no inguinal swelling Musculoskeletal:     Cervical back: Normal range of motion.  Skin:    General: Skin is warm and dry.  Neurological:     Mental Status: He is alert and oriented to person, place, and time.     (all labs ordered are listed, but only abnormal results are displayed) Labs Reviewed  LIPASE, BLOOD  COMPREHENSIVE METABOLIC PANEL  WITH GFR  CBC  URINALYSIS, ROUTINE W REFLEX MICROSCOPIC  TROPONIN T, HIGH SENSITIVITY    EKG: None  Radiology: No results found.   Procedures   Medications Ordered in the ED  morphine (PF) 4 MG/ML injection 4 mg (has no administration in time range)  ondansetron  (ZOFRAN ) injection 4 mg (has no administration in time range)  lactated ringers bolus 1,000 mL (has no administration in time range)                                    Medical Decision Making Amount and/or Complexity of Data Reviewed Labs: ordered. Radiology: ordered.  Risk Prescription drug management.   28yo male presents with concern for abdominal pain.   DDx includes appendicitis, pancreatitis, cholecystitis, pyelonephritis, nephrolithiasis, diverticulitis, PUD, gastritis, gastroenteritis, SBO, colitis. Doubt PE/ACS given brief cp/dyspnea in setting of severe abdominal pain and tenderness and in setting of vomiting and diarrhea.  No sign of groin swelling or tenderness and doubt testicular torsion, epididymitis.  EKG completed and personally interpreted by me shows normal sinus rhythm without acute ST changes.  Labs completed and personally evaluated and interpreted by me show no clinically significant anemia, electrolyte abnormalities, no sign of pancreatitis, no sign of hepatitis.  Troponin negative. UA without signs of UTI.  CT abdomen pelvis completed showing no acute abnormalities, diverticulosis without inflammation.  Given abdominal tenderness, nausea, diarrhea have low suspicion for intrathoracic etiology of symptoms.  Suspect symptoms secondary to gastroenteritis, possible PUD.  Given pain, nausea medicine and IV fluids while in the ED with improvement.   Given prescription for Bentyl, Zofran , pantoprazole. Patient discharged in stable condition with understanding of reasons to return.       Final diagnoses:  Left upper quadrant abdominal pain  Nausea vomiting and diarrhea    ED Discharge  Orders     None          Dreama Longs, MD 04/29/24 1030

## 2024-04-29 NOTE — ED Triage Notes (Signed)
 Left to mid abd pain this morning , nausea and emesis . Diarrhea x 4 since 4 Am.  Reports when he had severe pain , felt chest pain and shortness of breath . Resolved prior to arrival .  Alert and oriented x 4 , no obvious distress.

## 2024-05-05 ENCOUNTER — Emergency Department (HOSPITAL_BASED_OUTPATIENT_CLINIC_OR_DEPARTMENT_OTHER)
Admission: EM | Admit: 2024-05-05 | Discharge: 2024-05-05 | Disposition: A | Discharge status: Home / Self Care | Payer: Self-pay

## 2024-05-05 ENCOUNTER — Other Ambulatory Visit: Payer: Self-pay

## 2024-05-05 ENCOUNTER — Encounter (HOSPITAL_BASED_OUTPATIENT_CLINIC_OR_DEPARTMENT_OTHER): Payer: Self-pay | Admitting: Emergency Medicine

## 2024-05-05 DIAGNOSIS — K649 Unspecified hemorrhoids: Secondary | ICD-10-CM | POA: Insufficient documentation

## 2024-05-05 DIAGNOSIS — D72819 Decreased white blood cell count, unspecified: Secondary | ICD-10-CM | POA: Insufficient documentation

## 2024-05-05 DIAGNOSIS — K5900 Constipation, unspecified: Secondary | ICD-10-CM | POA: Insufficient documentation

## 2024-05-05 LAB — COMPREHENSIVE METABOLIC PANEL WITH GFR
ALT: 15 U/L (ref 0–44)
AST: 25 U/L (ref 15–41)
Albumin: 4.4 g/dL (ref 3.5–5.0)
Alkaline Phosphatase: 71 U/L (ref 38–126)
Anion gap: 11 (ref 5–15)
BUN: 8 mg/dL (ref 6–20)
CO2: 24 mmol/L (ref 22–32)
Calcium: 9 mg/dL (ref 8.9–10.3)
Chloride: 105 mmol/L (ref 98–111)
Creatinine, Ser: 0.87 mg/dL (ref 0.61–1.24)
GFR, Estimated: >60 mL/min (ref >60)
Glucose, Bld: 85 mg/dL (ref 70–99)
Potassium: 3.8 mmol/L (ref 3.5–5.1)
Sodium: 140 mmol/L (ref 135–145)
Total Bilirubin: 0.5 mg/dL (ref 0.0–1.2)
Total Protein: 7.5 g/dL (ref 6.5–8.1)

## 2024-05-05 LAB — CBC WITH DIFFERENTIAL/PLATELET
Abs Immature Granulocytes: 0 K/uL (ref 0.00–0.07)
Basophils Absolute: 0 K/uL (ref 0.0–0.1)
Basophils Relative: 1 %
Eosinophils Absolute: 0 K/uL (ref 0.0–0.5)
Eosinophils Relative: 1 %
HCT: 41.4 % (ref 39.0–52.0)
Hemoglobin: 14.1 g/dL (ref 13.0–17.0)
Immature Granulocytes: 0 %
Lymphocytes Relative: 53 %
Lymphs Abs: 1.8 K/uL (ref 0.7–4.0)
MCH: 31.3 pg (ref 26.0–34.0)
MCHC: 34.1 g/dL (ref 30.0–36.0)
MCV: 91.8 fL (ref 80.0–100.0)
Monocytes Absolute: 0.3 K/uL (ref 0.1–1.0)
Monocytes Relative: 9 %
Neutro Abs: 1.2 K/uL — ABNORMAL LOW (ref 1.7–7.7)
Neutrophils Relative %: 36 %
Platelets: 247 K/uL (ref 150–400)
RBC: 4.51 MIL/uL (ref 4.22–5.81)
RDW: 12 % (ref 11.5–15.5)
WBC: 3.4 K/uL — ABNORMAL LOW (ref 4.0–10.5)
nRBC: 0 % (ref 0.0–0.2)

## 2024-05-05 LAB — LACTIC ACID, PLASMA: Lactic Acid, Venous: 0.6 mmol/L (ref 0.5–1.9)

## 2024-05-05 NOTE — Discharge Instructions (Addendum)
 As discussed or take MiraLAX .  1 capful twice a day until having soft bowel movements.  You may take over-the-counter medication such as Tylenol  ibuprofen  for your abdominal wall soreness.  Return for fevers, chills, severe pain, hernia that does not reduce, uncontrolled nausea vomiting, stop passing gas or you develop any new or worsening symptoms that are concerning to you.

## 2024-05-05 NOTE — ED Triage Notes (Signed)
 Pt reports he lifted something > 100lbs at work last night and felt something in abd area pop out; c/o continued soreness to that area

## 2024-05-05 NOTE — ED Notes (Signed)

## 2024-05-05 NOTE — ED Provider Notes (Signed)
 [place] [facility] [place] Provider Note   CSN: [id number] Arrival date & time: 05/05/24  1157     Patient presents with: Muscle Pain   Edward Bray is a 28 y.o. male.   This is a 28 year old male presenting emergency department with concerns for feeling like his insides are coming out of him complaining of some periumbilical discomfort that started after he was lifting a heavy object at work that he states was over 100 pounds.  Had some abdominal wall soreness.  When he went to the bathroom later to try to have a bowel movement he felt like his insides were popping out.  He has been having some constipation and having to strain.  No nausea or vomiting.  He is still passing gas.  Reports his last bowel movement was 3 days ago.  Recently evaluated in the emergency department for abdominal pain.   Muscle Pain       Prior to Admission medications  Medication Sig Start Date End Date Taking? Authorizing Provider  cyclobenzaprine  (FLEXERIL ) 10 MG tablet Take 1 tablet (10 mg total) by mouth 2 (two) times daily as needed for muscle spasms. 01/14/23   Silver Wonda LABOR, PA  dicyclomine  (BENTYL ) 20 MG tablet Take 1 tablet (20 mg total) by mouth 2 (two) times daily. 04/29/24   Dreama Longs, MD  ibuprofen  (ADVIL ) 800 MG tablet Take 1 tablet (800 mg total) by mouth every 8 (eight) hours as needed. 02/05/23   Towana Ozell BROCKS, MD  methocarbamol  (ROBAXIN ) 500 MG tablet Take 1 tablet (500 mg total) by mouth 2 (two) times daily as needed for muscle spasms. 02/05/23   Towana Ozell BROCKS, MD  ondansetron  (ZOFRAN -ODT) 4 MG disintegrating tablet Take 1 tablet (4 mg total) by mouth every 8 (eight) hours as needed for nausea or vomiting. 04/29/24   Dreama Longs, MD  pantoprazole  (PROTONIX ) 20 MG tablet Take 2 tablets (40 mg total) by mouth daily. 04/29/24 05/13/24  Dreama Longs, MD  traMADol  (ULTRAM ) 50 MG tablet Take 1 tablet (50 mg total) by mouth every 6 (six)  hours as needed. 01/01/23   Fisher, Devere ORN, PA-C  promethazine  (PHENERGAN ) 25 MG tablet Take 1 tablet (25 mg total) by mouth every 6 (six) hours as needed for nausea or vomiting. 05/21/14 08/04/19  Griselda Norris, MD    Allergies: Patient has no known allergies.    Review of Systems  Updated Vital Signs BP 121/80 (BP Location: Right Arm)   Pulse (!) 59   Temp 98.3 F (36.8 C) (Oral)   Resp 16   Ht 5' 9 (1.753 m)   Wt 88.5 kg   SpO2 100%   BMI 28.80 kg/m   Physical Exam Vitals and nursing note reviewed.  Constitutional:      General: He is not in acute distress.    Appearance: He is not toxic-appearing.  HENT:     Head: Normocephalic.     Nose: Nose normal.  Eyes:     Conjunctiva/sclera: Conjunctivae normal.  Pulmonary:     Effort: Pulmonary effort is normal.     Breath sounds: Normal breath sounds.  Abdominal:     General: Abdomen is flat. There is no distension.     Palpations: Abdomen is soft.     Tenderness: There is no abdominal tenderness. There is no right CVA tenderness, left CVA tenderness or guarding.     Hernia: No hernia is present.  Genitourinary:    Comments: External rectal exam with  nonthrombosed hemorrhoid Skin:    General: Skin is warm and dry.     Capillary Refill: Capillary refill takes less than 2 seconds.  Neurological:     Mental Status: He is alert and oriented to person, place, and time.  Psychiatric:        Mood and Affect: Mood normal.        Behavior: Behavior normal.     (all labs ordered are listed, but only abnormal results are displayed) Labs Reviewed  CBC WITH DIFFERENTIAL/PLATELET - Abnormal; Notable for the following components:      Result Value   WBC 3.4 (*)    Neutro Abs 1.2 (*)    All other components within normal limits  COMPREHENSIVE METABOLIC PANEL WITH GFR  LACTIC ACID, PLASMA    EKG: None  Radiology: No results found.   Procedures   Medications Ordered in the ED - No data to display  Clinical Course as  of 05/05/24 1327  Wed May 05, 2024  1318 cT abd on 12/11: IMPRESSION: 1. No evidence of diverticulitis. 2. Sigmoid diverticulosis without acute inflammation.  [TY]    Clinical Course User Index [TY] Neysa Caron PARAS, DO                                 Medical Decision Making Well-appearing 28 year old male presenting emergency department with abdominal pain and hemorrhoid.  He is afebrile nontachycardic, normotensive.  Maintaining oxygen saturation on room air.  Does not appear to be in significant distress.  Soft abdomen with some minor tenderness to the abdominal wall between umbilicus and xiphoid process.  Did not appreciate defect in the abdominal wall or hernias.  The feeling like something was popping out of him is likely the hemorrhoid that he has on exam.  Labs ordered in triage reassuring no leukocytosis to suggest systemic infection.  No anemia.  Comprehensive panel normal AST ALT normal.  Lactate without evidence of elevation consistent with bowel obstruction/ischemia.  Did have recent CT scan on 12/11 was normal.  Do not feel that he would benefit from repeat scan at this point.  Discussed return precautions.  Discussed bowel regiment to help with his constipation and hemorrhoid.  Will discharge in stable condition.  Amount and/or Complexity of Data Reviewed External Data Reviewed:     Details: See ED course Labs: ordered.       Final diagnoses:  None    ED Discharge Orders     None          Neysa Caron PARAS, DO 05/05/24 1327
